# Patient Record
Sex: Female | Born: 1955 | Race: White | Hispanic: No | Marital: Married | Smoking: Never smoker
Health system: Southern US, Community
[De-identification: ages and names within clinical notes are randomized; demographics above are authoritative.]

## PROBLEM LIST (undated history)

## (undated) DIAGNOSIS — N289 Disorder of kidney and ureter, unspecified: Secondary | ICD-10-CM

## (undated) DIAGNOSIS — R112 Nausea with vomiting, unspecified: Secondary | ICD-10-CM

## (undated) DIAGNOSIS — A048 Other specified bacterial intestinal infections: Secondary | ICD-10-CM

## (undated) DIAGNOSIS — I7300 Raynaud's syndrome without gangrene: Secondary | ICD-10-CM

## (undated) DIAGNOSIS — N6310 Unspecified lump in the right breast, unspecified quadrant: Secondary | ICD-10-CM

## (undated) DIAGNOSIS — M1990 Unspecified osteoarthritis, unspecified site: Secondary | ICD-10-CM

## (undated) DIAGNOSIS — F32A Depression, unspecified: Secondary | ICD-10-CM

## (undated) DIAGNOSIS — T8859XA Other complications of anesthesia, initial encounter: Secondary | ICD-10-CM

## (undated) DIAGNOSIS — E0500 Thyrotoxicosis with diffuse goiter without thyrotoxic crisis or storm: Secondary | ICD-10-CM

## (undated) DIAGNOSIS — K760 Fatty (change of) liver, not elsewhere classified: Secondary | ICD-10-CM

## (undated) DIAGNOSIS — J45909 Unspecified asthma, uncomplicated: Secondary | ICD-10-CM

## (undated) DIAGNOSIS — T4145XA Adverse effect of unspecified anesthetic, initial encounter: Secondary | ICD-10-CM

## (undated) DIAGNOSIS — F329 Major depressive disorder, single episode, unspecified: Secondary | ICD-10-CM

## (undated) DIAGNOSIS — K635 Polyp of colon: Secondary | ICD-10-CM

## (undated) DIAGNOSIS — F419 Anxiety disorder, unspecified: Secondary | ICD-10-CM

## (undated) DIAGNOSIS — Z9889 Other specified postprocedural states: Secondary | ICD-10-CM

## (undated) DIAGNOSIS — N390 Urinary tract infection, site not specified: Secondary | ICD-10-CM

## (undated) DIAGNOSIS — E0590 Thyrotoxicosis, unspecified without thyrotoxic crisis or storm: Secondary | ICD-10-CM

## (undated) DIAGNOSIS — I639 Cerebral infarction, unspecified: Secondary | ICD-10-CM

## (undated) DIAGNOSIS — H16229 Keratoconjunctivitis sicca, not specified as Sjogren's, unspecified eye: Secondary | ICD-10-CM

## (undated) DIAGNOSIS — E785 Hyperlipidemia, unspecified: Secondary | ICD-10-CM

## (undated) DIAGNOSIS — M810 Age-related osteoporosis without current pathological fracture: Secondary | ICD-10-CM

## (undated) HISTORY — DX: Urinary tract infection, site not specified: N39.0

## (undated) HISTORY — DX: Keratoconjunctivitis sicca, not specified as Sjogren's, unspecified eye: H16.229

## (undated) HISTORY — PX: PARATHYROIDECTOMY: SHX19

## (undated) HISTORY — DX: Fatty (change of) liver, not elsewhere classified: K76.0

## (undated) HISTORY — DX: Unspecified asthma, uncomplicated: J45.909

## (undated) HISTORY — PX: CHOLECYSTECTOMY: SHX55

## (undated) HISTORY — PX: ABDOMINAL HYSTERECTOMY: SHX81

## (undated) HISTORY — DX: Other specified bacterial intestinal infections: A04.8

## (undated) HISTORY — DX: Polyp of colon: K63.5

---

## 1998-02-06 ENCOUNTER — Ambulatory Visit (HOSPITAL_COMMUNITY): Admission: RE | Admit: 1998-02-06 | Discharge: 1998-02-06 | Payer: Self-pay | Admitting: Endocrinology

## 1998-02-06 ENCOUNTER — Encounter: Payer: Self-pay | Admitting: Endocrinology

## 2014-11-30 ENCOUNTER — Encounter: Payer: Self-pay | Admitting: Gastroenterology

## 2014-11-30 HISTORY — PX: COLONOSCOPY: SHX174

## 2015-04-09 HISTORY — PX: BREAST EXCISIONAL BIOPSY: SUR124

## 2015-04-09 HISTORY — PX: BREAST LUMPECTOMY: SHX2

## 2016-02-13 ENCOUNTER — Other Ambulatory Visit: Payer: Self-pay | Admitting: Internal Medicine

## 2016-02-13 DIAGNOSIS — N6489 Other specified disorders of breast: Secondary | ICD-10-CM

## 2016-02-19 ENCOUNTER — Ambulatory Visit
Admission: RE | Admit: 2016-02-19 | Discharge: 2016-02-19 | Disposition: A | Discharge status: Home / Self Care | Payer: BC Managed Care – PPO | Source: Ambulatory Visit | Attending: Internal Medicine | Admitting: Internal Medicine

## 2016-02-19 DIAGNOSIS — N6489 Other specified disorders of breast: Secondary | ICD-10-CM

## 2016-03-04 ENCOUNTER — Ambulatory Visit: Payer: Self-pay | Admitting: General Surgery

## 2016-03-04 DIAGNOSIS — N6021 Fibroadenosis of right breast: Secondary | ICD-10-CM

## 2016-03-12 ENCOUNTER — Other Ambulatory Visit: Payer: Self-pay | Admitting: General Surgery

## 2016-03-12 DIAGNOSIS — N6021 Fibroadenosis of right breast: Secondary | ICD-10-CM

## 2016-03-18 ENCOUNTER — Encounter (HOSPITAL_BASED_OUTPATIENT_CLINIC_OR_DEPARTMENT_OTHER): Payer: Self-pay | Admitting: *Deleted

## 2016-03-21 ENCOUNTER — Ambulatory Visit
Admission: RE | Admit: 2016-03-21 | Discharge: 2016-03-21 | Disposition: A | Discharge status: Home / Self Care | Payer: BC Managed Care – PPO | Source: Ambulatory Visit | Attending: General Surgery | Admitting: General Surgery

## 2016-03-21 DIAGNOSIS — N6021 Fibroadenosis of right breast: Secondary | ICD-10-CM

## 2016-03-21 NOTE — OR Nursing (Signed)
At 1426 Patient given Boost Breeze drink per breast patient surgery protocol. Boost Breeze drink to be drank in the am and completed by 06:30. Larene BeachSandy Alyrica Thurow, RN BSN CAPA, AD/MCSC.

## 2016-03-22 ENCOUNTER — Ambulatory Visit (HOSPITAL_BASED_OUTPATIENT_CLINIC_OR_DEPARTMENT_OTHER): Payer: BC Managed Care – PPO | Admitting: Anesthesiology

## 2016-03-22 ENCOUNTER — Encounter (HOSPITAL_BASED_OUTPATIENT_CLINIC_OR_DEPARTMENT_OTHER)
Admission: RE | Disposition: A | Discharge status: Home / Self Care | Payer: Self-pay | Source: Ambulatory Visit | Attending: General Surgery

## 2016-03-22 ENCOUNTER — Encounter (HOSPITAL_BASED_OUTPATIENT_CLINIC_OR_DEPARTMENT_OTHER): Payer: Self-pay | Admitting: *Deleted

## 2016-03-22 ENCOUNTER — Ambulatory Visit (HOSPITAL_BASED_OUTPATIENT_CLINIC_OR_DEPARTMENT_OTHER)
Admission: RE | Admit: 2016-03-22 | Discharge: 2016-03-22 | Disposition: A | Discharge status: Home / Self Care | Payer: BC Managed Care – PPO | Source: Ambulatory Visit | Attending: General Surgery | Admitting: General Surgery

## 2016-03-22 ENCOUNTER — Ambulatory Visit
Admission: RE | Admit: 2016-03-22 | Discharge: 2016-03-22 | Disposition: A | Discharge status: Home / Self Care | Payer: BC Managed Care – PPO | Source: Ambulatory Visit | Attending: General Surgery | Admitting: General Surgery

## 2016-03-22 DIAGNOSIS — Z8349 Family history of other endocrine, nutritional and metabolic diseases: Secondary | ICD-10-CM | POA: Insufficient documentation

## 2016-03-22 DIAGNOSIS — E7800 Pure hypercholesterolemia, unspecified: Secondary | ICD-10-CM | POA: Insufficient documentation

## 2016-03-22 DIAGNOSIS — Z8249 Family history of ischemic heart disease and other diseases of the circulatory system: Secondary | ICD-10-CM | POA: Diagnosis not present

## 2016-03-22 DIAGNOSIS — E079 Disorder of thyroid, unspecified: Secondary | ICD-10-CM | POA: Insufficient documentation

## 2016-03-22 DIAGNOSIS — Z9049 Acquired absence of other specified parts of digestive tract: Secondary | ICD-10-CM | POA: Diagnosis not present

## 2016-03-22 DIAGNOSIS — F329 Major depressive disorder, single episode, unspecified: Secondary | ICD-10-CM | POA: Insufficient documentation

## 2016-03-22 DIAGNOSIS — Z90722 Acquired absence of ovaries, bilateral: Secondary | ICD-10-CM | POA: Insufficient documentation

## 2016-03-22 DIAGNOSIS — N6489 Other specified disorders of breast: Secondary | ICD-10-CM | POA: Diagnosis present

## 2016-03-22 DIAGNOSIS — N63 Unspecified lump in unspecified breast: Secondary | ICD-10-CM | POA: Diagnosis present

## 2016-03-22 DIAGNOSIS — N6021 Fibroadenosis of right breast: Secondary | ICD-10-CM | POA: Insufficient documentation

## 2016-03-22 DIAGNOSIS — Z8041 Family history of malignant neoplasm of ovary: Secondary | ICD-10-CM | POA: Insufficient documentation

## 2016-03-22 DIAGNOSIS — Z79899 Other long term (current) drug therapy: Secondary | ICD-10-CM | POA: Diagnosis not present

## 2016-03-22 DIAGNOSIS — F419 Anxiety disorder, unspecified: Secondary | ICD-10-CM | POA: Diagnosis not present

## 2016-03-22 DIAGNOSIS — Z8261 Family history of arthritis: Secondary | ICD-10-CM | POA: Insufficient documentation

## 2016-03-22 DIAGNOSIS — N6081 Other benign mammary dysplasias of right breast: Secondary | ICD-10-CM | POA: Diagnosis not present

## 2016-03-22 DIAGNOSIS — N6011 Diffuse cystic mastopathy of right breast: Secondary | ICD-10-CM | POA: Insufficient documentation

## 2016-03-22 DIAGNOSIS — Z833 Family history of diabetes mellitus: Secondary | ICD-10-CM | POA: Diagnosis not present

## 2016-03-22 DIAGNOSIS — Z90710 Acquired absence of both cervix and uterus: Secondary | ICD-10-CM | POA: Insufficient documentation

## 2016-03-22 DIAGNOSIS — Z86010 Personal history of colonic polyps: Secondary | ICD-10-CM | POA: Diagnosis not present

## 2016-03-22 HISTORY — DX: Adverse effect of unspecified anesthetic, initial encounter: T41.45XA

## 2016-03-22 HISTORY — DX: Depression, unspecified: F32.A

## 2016-03-22 HISTORY — DX: Thyrotoxicosis with diffuse goiter without thyrotoxic crisis or storm: E05.00

## 2016-03-22 HISTORY — DX: Other specified postprocedural states: Z98.890

## 2016-03-22 HISTORY — DX: Other complications of anesthesia, initial encounter: T88.59XA

## 2016-03-22 HISTORY — DX: Age-related osteoporosis without current pathological fracture: M81.0

## 2016-03-22 HISTORY — DX: Unspecified lump in the right breast, unspecified quadrant: N63.10

## 2016-03-22 HISTORY — DX: Raynaud's syndrome without gangrene: I73.00

## 2016-03-22 HISTORY — DX: Cerebral infarction, unspecified: I63.9

## 2016-03-22 HISTORY — PX: BREAST LUMPECTOMY WITH RADIOACTIVE SEED LOCALIZATION: SHX6424

## 2016-03-22 HISTORY — DX: Other specified postprocedural states: R11.2

## 2016-03-22 HISTORY — DX: Major depressive disorder, single episode, unspecified: F32.9

## 2016-03-22 HISTORY — DX: Thyrotoxicosis, unspecified without thyrotoxic crisis or storm: E05.90

## 2016-03-22 HISTORY — DX: Unspecified osteoarthritis, unspecified site: M19.90

## 2016-03-22 HISTORY — DX: Anxiety disorder, unspecified: F41.9

## 2016-03-22 HISTORY — DX: Hyperlipidemia, unspecified: E78.5

## 2016-03-22 SURGERY — BREAST LUMPECTOMY WITH RADIOACTIVE SEED LOCALIZATION
Anesthesia: General | Site: Breast | Laterality: Right

## 2016-03-22 MED ORDER — ONDANSETRON HCL 4 MG/2ML IJ SOLN
INTRAMUSCULAR | Status: AC
  Filled 2016-03-22: qty 2

## 2016-03-22 MED ORDER — DEXAMETHASONE SODIUM PHOSPHATE 10 MG/ML IJ SOLN
INTRAMUSCULAR | Status: AC
  Filled 2016-03-22: qty 1

## 2016-03-22 MED ORDER — CHLORHEXIDINE GLUCONATE CLOTH 2 % EX PADS: 6.0000 | MEDICATED_PAD | Freq: Once | CUTANEOUS | Status: DC

## 2016-03-22 MED ORDER — BACITRACIN ZINC 500 UNIT/GM EX OINT
TOPICAL_OINTMENT | CUTANEOUS | Status: AC
  Filled 2016-03-22: qty 1.8

## 2016-03-22 MED ORDER — PROPOFOL 10 MG/ML IV BOLUS
INTRAVENOUS | Status: AC
  Filled 2016-03-22: qty 20

## 2016-03-22 MED ORDER — MEPERIDINE HCL 25 MG/ML IJ SOLN: 6.2500 mg | INTRAMUSCULAR | Status: DC | PRN

## 2016-03-22 MED ORDER — FENTANYL CITRATE (PF) 100 MCG/2ML IJ SOLN
INTRAMUSCULAR | Status: AC
  Filled 2016-03-22: qty 2

## 2016-03-22 MED ORDER — LIDOCAINE 2% (20 MG/ML) 5 ML SYRINGE
INTRAMUSCULAR | Status: DC | PRN
  Administered 2016-03-22: 100 mg via INTRAVENOUS

## 2016-03-22 MED ORDER — BUPIVACAINE HCL (PF) 0.25 % IJ SOLN
INTRAMUSCULAR | Status: AC
Start: 2016-03-22 — End: 2016-03-22
  Filled 2016-03-22: qty 30

## 2016-03-22 MED ORDER — BUPIVACAINE-EPINEPHRINE (PF) 0.5% -1:200000 IJ SOLN
INTRAMUSCULAR | Status: AC
  Filled 2016-03-22: qty 1.8

## 2016-03-22 MED ORDER — LIDOCAINE 2% (20 MG/ML) 5 ML SYRINGE
INTRAMUSCULAR | Status: AC
  Filled 2016-03-22: qty 5

## 2016-03-22 MED ORDER — OXYCODONE HCL 5 MG/5ML PO SOLN: 5.0000 mg | Freq: Once | ORAL | Status: DC | PRN

## 2016-03-22 MED ORDER — HYDROMORPHONE HCL 1 MG/ML IJ SOLN
INTRAMUSCULAR | Status: AC
  Filled 2016-03-22: qty 1

## 2016-03-22 MED ORDER — HYDROMORPHONE HCL 1 MG/ML IJ SOLN
0.2500 mg | INTRAMUSCULAR | Status: DC | PRN
  Administered 2016-03-22 (×2): 0.25 mg via INTRAVENOUS

## 2016-03-22 MED ORDER — BUPIVACAINE-EPINEPHRINE (PF) 0.25% -1:200000 IJ SOLN
INTRAMUSCULAR | Status: AC
  Filled 2016-03-22: qty 30

## 2016-03-22 MED ORDER — HYDROCODONE-ACETAMINOPHEN 5-325 MG PO TABS: 1.0000 | ORAL_TABLET | ORAL | 0 refills | Status: DC | PRN

## 2016-03-22 MED ORDER — CEFAZOLIN SODIUM-DEXTROSE 2-4 GM/100ML-% IV SOLN
2.0000 g | INTRAVENOUS | Status: AC
  Administered 2016-03-22: 2 g via INTRAVENOUS

## 2016-03-22 MED ORDER — OXYCODONE HCL 5 MG PO TABS: 5.0000 mg | ORAL_TABLET | Freq: Once | ORAL | Status: DC | PRN

## 2016-03-22 MED ORDER — CEFAZOLIN SODIUM-DEXTROSE 2-4 GM/100ML-% IV SOLN
INTRAVENOUS | Status: AC
  Filled 2016-03-22: qty 100

## 2016-03-22 MED ORDER — PROPOFOL 500 MG/50ML IV EMUL
INTRAVENOUS | Status: AC
  Filled 2016-03-22: qty 50

## 2016-03-22 MED ORDER — DEXAMETHASONE SODIUM PHOSPHATE 4 MG/ML IJ SOLN
INTRAMUSCULAR | Status: DC | PRN
  Administered 2016-03-22: 10 mg via INTRAVENOUS

## 2016-03-22 MED ORDER — LACTATED RINGERS IV SOLN
INTRAVENOUS | Status: DC
  Administered 2016-03-22: 10 mL/h via INTRAVENOUS
  Administered 2016-03-22: 12:00:00 via INTRAVENOUS

## 2016-03-22 MED ORDER — MIDAZOLAM HCL 2 MG/2ML IJ SOLN
1.0000 mg | INTRAMUSCULAR | Status: DC | PRN
  Administered 2016-03-22 (×2): 1 mg via INTRAVENOUS

## 2016-03-22 MED ORDER — ONDANSETRON HCL 4 MG/2ML IJ SOLN
INTRAMUSCULAR | Status: DC | PRN
  Administered 2016-03-22: 4 mg via INTRAVENOUS

## 2016-03-22 MED ORDER — CIPROFLOXACIN-DEXAMETHASONE 0.3-0.1 % OT SUSP
OTIC | Status: AC
  Filled 2016-03-22: qty 7.5

## 2016-03-22 MED ORDER — BUPIVACAINE HCL (PF) 0.25 % IJ SOLN
INTRAMUSCULAR | Status: DC | PRN
  Administered 2016-03-22: 20 mL

## 2016-03-22 MED ORDER — SCOPOLAMINE 1 MG/3DAYS TD PT72: 1.0000 | MEDICATED_PATCH | Freq: Once | TRANSDERMAL | Status: DC | PRN

## 2016-03-22 MED ORDER — LIDOCAINE HCL 2 % IJ SOLN
INTRAMUSCULAR | Status: AC
  Filled 2016-03-22: qty 20

## 2016-03-22 MED ORDER — PROPOFOL 10 MG/ML IV BOLUS
INTRAVENOUS | Status: DC | PRN
  Administered 2016-03-22: 150 mg via INTRAVENOUS

## 2016-03-22 MED ORDER — PROMETHAZINE HCL 25 MG/ML IJ SOLN: 6.2500 mg | INTRAMUSCULAR | Status: DC | PRN

## 2016-03-22 MED ORDER — FENTANYL CITRATE (PF) 100 MCG/2ML IJ SOLN
50.0000 ug | INTRAMUSCULAR | Status: AC | PRN
  Administered 2016-03-22: 25 ug via INTRAVENOUS
  Administered 2016-03-22: 50 ug via INTRAVENOUS
  Administered 2016-03-22: 25 ug via INTRAVENOUS

## 2016-03-22 MED ORDER — MIDAZOLAM HCL 2 MG/2ML IJ SOLN
INTRAMUSCULAR | Status: AC
  Filled 2016-03-22: qty 2

## 2016-03-22 SURGICAL SUPPLY — 47 items
ADH SKN CLS APL DERMABOND .7 (GAUZE/BANDAGES/DRESSINGS) ×1
APPLIER CLIP 9.375 MED OPEN (MISCELLANEOUS)
APR CLP MED 9.3 20 MLT OPN (MISCELLANEOUS)
BLADE SURG 15 STRL LF DISP TIS (BLADE) ×1 IMPLANT
BLADE SURG 15 STRL SS (BLADE) ×3
CANISTER SUC SOCK COL 7IN (MISCELLANEOUS) ×1 IMPLANT
CANISTER SUCT 1200ML W/VALVE (MISCELLANEOUS) ×3 IMPLANT
CHLORAPREP W/TINT 26ML (MISCELLANEOUS) ×3 IMPLANT
CLIP APPLIE 9.375 MED OPEN (MISCELLANEOUS) IMPLANT
COVER BACK TABLE 60X90IN (DRAPES) ×3 IMPLANT
COVER MAYO STAND STRL (DRAPES) ×3 IMPLANT
COVER PROBE W GEL 5X96 (DRAPES) ×3 IMPLANT
DECANTER SPIKE VIAL GLASS SM (MISCELLANEOUS) IMPLANT
DERMABOND ADVANCED (GAUZE/BANDAGES/DRESSINGS) ×2
DERMABOND ADVANCED .7 DNX12 (GAUZE/BANDAGES/DRESSINGS) ×1 IMPLANT
DEVICE DUBIN W/COMP PLATE 8390 (MISCELLANEOUS) ×3 IMPLANT
DRAPE LAPAROSCOPIC ABDOMINAL (DRAPES) ×2 IMPLANT
DRAPE UTILITY XL STRL (DRAPES) ×3 IMPLANT
ELECT COATED BLADE 2.86 ST (ELECTRODE) ×3 IMPLANT
ELECT REM PT RETURN 9FT ADLT (ELECTROSURGICAL) ×3
ELECTRODE REM PT RTRN 9FT ADLT (ELECTROSURGICAL) ×1 IMPLANT
GLOVE BIO SURGEON STRL SZ7.5 (GLOVE) ×6 IMPLANT
GLOVE BIOGEL PI IND STRL 7.0 (GLOVE) IMPLANT
GLOVE BIOGEL PI INDICATOR 7.0 (GLOVE) ×2
GLOVE EXAM NITRILE EXT CUFF MD (GLOVE) ×2 IMPLANT
GLOVE SURG SS PI 6.5 STRL IVOR (GLOVE) ×2 IMPLANT
GOWN STRL REUS W/ TWL LRG LVL3 (GOWN DISPOSABLE) ×2 IMPLANT
GOWN STRL REUS W/TWL LRG LVL3 (GOWN DISPOSABLE) ×6
ILLUMINATOR WAVEGUIDE N/F (MISCELLANEOUS) IMPLANT
KIT MARKER MARGIN INK (KITS) ×3 IMPLANT
LIGHT WAVEGUIDE WIDE FLAT (MISCELLANEOUS) ×2 IMPLANT
NDL HYPO 25X1 1.5 SAFETY (NEEDLE) IMPLANT
NEEDLE HYPO 25X1 1.5 SAFETY (NEEDLE) ×3 IMPLANT
NS IRRIG 1000ML POUR BTL (IV SOLUTION) ×2 IMPLANT
PACK BASIN DAY SURGERY FS (CUSTOM PROCEDURE TRAY) ×3 IMPLANT
PENCIL BUTTON HOLSTER BLD 10FT (ELECTRODE) ×3 IMPLANT
SLEEVE SCD COMPRESS KNEE MED (MISCELLANEOUS) ×3 IMPLANT
SPONGE LAP 18X18 X RAY DECT (DISPOSABLE) ×3 IMPLANT
SUT MON AB 4-0 PC3 18 (SUTURE) ×2 IMPLANT
SUT SILK 2 0 SH (SUTURE) IMPLANT
SUT VICRYL 3-0 CR8 SH (SUTURE) ×3 IMPLANT
SYR CONTROL 10ML LL (SYRINGE) ×2 IMPLANT
TOWEL OR 17X24 6PK STRL BLUE (TOWEL DISPOSABLE) ×3 IMPLANT
TOWEL OR NON WOVEN STRL DISP B (DISPOSABLE) ×1 IMPLANT
TUBE CONNECTING 20'X1/4 (TUBING) ×1
TUBE CONNECTING 20X1/4 (TUBING) ×2 IMPLANT
YANKAUER SUCT BULB TIP NO VENT (SUCTIONS) ×2 IMPLANT

## 2016-03-22 NOTE — Discharge Instructions (Signed)

## 2016-03-22 NOTE — Op Note (Signed)
03/22/2016  11:18 AM  PATIENT:  Beverly Perry  60 y.o. female  PRE-OPERATIVE DIAGNOSIS:  RIGHT BREAST COMPLEX SCLEROSING LESION  POST-OPERATIVE DIAGNOSIS:  RIGHT BREAST COMPLEX SCLEROSING LESION  PROCEDURE:  Procedure(s): RIGHT BREAST LUMPECTOMY WITH RADIOACTIVE SEED LOCALIZATION (Right)  SURGEON:  Surgeon(s) and Role:    * Griselda MinerPaul Toth III, MD - Primary  PHYSICIAN ASSISTANT:   ASSISTANTS: none   ANESTHESIA:   local and general  EBL:  No intake/output data recorded.  BLOOD ADMINISTERED:none  DRAINS: none   LOCAL MEDICATIONS USED:  MARCAINE     SPECIMEN:  Source of Specimen:  right breast tissue and additional inferior and deep margin  DISPOSITION OF SPECIMEN:  PATHOLOGY  COUNTS:  YES  TOURNIQUET:  * No tourniquets in log *  DICTATION: .Dragon Dictation   After informed consent was obtained the patient was brought to the operating room and placed in the supine position on the operating room table. After adequate induction of general anesthesia the patient's right breast was prepped with ChloraPrep, allowed to dry, and draped in usual sterile manner. An appropriate timeout was performed. Previously an I-125 seed was placed in the upper outer quadrant of the right breast to mark an area of a complex sclerosing lesion. The neoprobe was set to I-125. The area of radioactivity was readily identified in the upper outer right breast. Next a curvilinear incision was made along the upper outer edge of the areola. The incision was carried through the skin and subcutaneous tissue sharply with electrocautery. The dissection was then carried toward the seed under the direction of the neoprobe sharply with the electrocautery. Once I approach the area of the radioactive seed then removed a circular portion of breast tissue sharply around the radioactive seed with the electrocautery while checking the area of radioactivity frequently with the neoprobe. Once the specimen was removed it was oriented  with the appropriate paint colors. A specimen radiograph was obtained that showed the seed to be near the center of the specimen. I did not see the clip. I then removed and additional inferior and deep margin sharply with the electrocautery. I x-rayed this tissue and the clip was in this tissue. This was sent separately to pathology. Hemostasis was achieved using the Bovie electrocautery. The wound was then irrigated with saline and infiltrated with quarter percent Marcaine. The deep layer of the wound was then closed with layers of interrupted 3-0 Vicryl stitches. The skin was then closed with interrupted 4-0 Monocryl subcuticular stitches. Dermabond dressings were applied. The patient tolerated the procedure well. At the end of the case all needle sponge and instrument counts were correct. The patient was then awakened and taken to recovery in stable condition.  PLAN OF CARE: Discharge to home after PACU  PATIENT DISPOSITION:  PACU - hemodynamically stable.   Delay start of Pharmacological VTE agent (>24hrs) due to surgical blood loss or risk of bleeding: not applicable

## 2016-03-22 NOTE — Interval H&P Note (Signed)
History and Physical Interval Note:  03/22/2016 10:04 AM  Beverly Perry  has presented today for surgery, with the diagnosis of RIGHT BREAST COMPLEX SCLEROSING LESION  The various methods of treatment have been discussed with the patient and family. After consideration of risks, benefits and other options for treatment, the patient has consented to  Procedure(s): RIGHT BREAST LUMPECTOMY WITH RADIOACTIVE SEED LOCALIZATION (N/A) as a surgical intervention .  The patient's history has been reviewed, patient examined, no change in status, stable for surgery.  I have reviewed the patient's chart and labs.  Questions were answered to the patient's satisfaction.     TOTH III,PAUL S

## 2016-03-22 NOTE — H&P (Signed)
[facility]  [facility] Patient #: [id number] DOB: Mar 02, 1956 Married / Language: English / Race: White Female   History of Present Illness  The patient is a 60 year old female who presents with a breast mass. We are asked to see the patient in consultation by Dr. Anselmo Picklerandy Jackson to evaluate her for a right breast complex sclerosing lesion. The patient is a 60 year old white female who recently went for a routine screening mammogram. At that time she was found to have an abnormality in the upper outer quadrant of the right breast. This was biopsied and came back as a complex sclerosing lesion. She denies any significant breast pain. She denies any discharge from the nipple. She has had a previous left sided needle biopsy but she does not know what it showed. This was done in [place] 6 or 7 years ago. She otherwise denies any significant family history of breast cancer   Other Problems  Anxiety Disorder  Cholelithiasis  Depression  Hypercholesterolemia  Oophorectomy  Bilateral. Thyroid Disease   Past Surgical History  Breast Biopsy  Bilateral. Colon Polyp Removal - Colonoscopy  Gallbladder Surgery - Laparoscopic  Hysterectomy (not due to cancer) - Complete  Thyroid Surgery  Tonsillectomy   Diagnostic Studies History  Colonoscopy  1-5 years ago Mammogram  within last year Pap Smear  >5 years ago  Allergies No Known Drug Allergies   Medication History  ClonazePAM (0.5MG  Tablet, Oral two times daily) Active. Ultram (50MG  Tablet, Oral as needed) Active. (Take 1/2 to 1 tab by mouth as needed.) Acetaminophen (325MG  Tablet, Oral daily) Active. Pilocarpine (5mg  three times daily) Active. Synthroid (137MCG Tablet, Oral daily) Active. Vitamin D (50000U Capsule, Oral daily) Active. Calcium 1000 + D (1000-800MG -UNIT Tablet, Oral daily) Active. Wellbutrin SR (150MG  Tablet ER 12HR, Oral daily) Active. (Take 3 tablets daily.) Fish  Oil (300MG  Capsule, Oral daily) Active. B12 Liquid Health Booster (1000MCG/15ML Liquid, injection Oral once a month) Active. Prolia (60MG /ML Solution, Subcutaneous) Active. (Inject once a month.) Medications Reconciled  Social History Alcohol use  Occasional alcohol use. Caffeine use  Coffee. No drug use  Tobacco use  Never smoker.  Family History  Anesthetic complications  Sister. Arthritis  Father. Cerebrovascular Accident  Father, Sister. Colon Polyps  Family Members In General. Depression  Family Members In General. Diabetes Mellitus  Father. Heart Disease  Father, Sister. Heart disease in female family member before age 61  Heart disease in female family member before age 61  Hypertension  Father, Sister. Ovarian Cancer  Sister. Prostate Cancer  Father. Thyroid problems  Family Members In General, Father.  Pregnancy / Birth History  Age at menarche  9 years. Contraceptive History  Oral contraceptives. Gravida  2 Maternal age  61-20 Para  2    Review of Systems  General Not Present- Appetite Loss, Chills, Fatigue, Fever, Night Sweats, Weight Gain and Weight Loss. Skin Present- Dryness. Not Present- Change in Wart/Mole, Hives, Jaundice, New Lesions, Non-Healing Wounds, Rash and Ulcer. HEENT Present- Seasonal Allergies and Wears glasses/contact lenses. Not Present- Earache, Hearing Loss, Hoarseness, Nose Bleed, Oral Ulcers, Ringing in the Ears, Sinus Pain, Sore Throat, Visual Disturbances and Yellow Eyes. Respiratory Present- Snoring. Not Present- Bloody sputum, Chronic Cough, Difficulty Breathing and Wheezing. Breast Present- Breast Mass. Not Present- Breast Pain, Nipple Discharge and Skin Changes. Cardiovascular Present- Leg Cramps. Not Present- Chest Pain, Difficulty Breathing Lying Down, Palpitations, Rapid Heart Rate, Shortness of Breath and Swelling of Extremities. Gastrointestinal Not Present- Abdominal Pain, Bloating, Bloody  Stool, Change  in Bowel Habits, Chronic diarrhea, Constipation, Difficulty Swallowing, Excessive gas, Gets full quickly at meals, Hemorrhoids, Indigestion, Nausea, Rectal Pain and Vomiting. Female Genitourinary Not Present- Frequency, Nocturia, Painful Urination, Pelvic Pain and Urgency. Musculoskeletal Present- Joint Pain and Muscle Pain. Not Present- Back Pain, Joint Stiffness, Muscle Weakness and Swelling of Extremities. Neurological Not Present- Decreased Memory, Fainting, Headaches, Numbness, Seizures, Tingling, Tremor, Trouble walking and Weakness. Psychiatric Present- Anxiety and Depression. Not Present- Bipolar, Change in Sleep Pattern, Fearful and Frequent crying. Endocrine Not Present- Cold Intolerance, Excessive Hunger, Hair Changes, Heat Intolerance, Hot flashes and New Diabetes. Hematology Present- Blood Thinners. Not Present- Easy Bruising, Excessive bleeding, Gland problems, HIV and Persistent Infections.  Vitals Weight: 138.2 lb Height: 62in Body Surface Area: 1.63 m Body Mass Index: 25.28 kg/m  Temp.: 98.41F  Pulse: 68 (Regular)  BP: 122/82 (Sitting, Left Arm, Standard)       Physical Exam General Mental Status-Alert. General Appearance-Consistent with stated age. Hydration-Well hydrated. Voice-Normal.  Head and Neck Head-normocephalic, atraumatic with no lesions or palpable masses. Trachea-midline. Thyroid Gland Characteristics - normal size and consistency.  Eye Eyeball - Bilateral-Extraocular movements intact. Sclera/Conjunctiva - Bilateral-No scleral icterus.  Chest and Lung Exam Chest and lung exam reveals -quiet, even and easy respiratory effort with no use of accessory muscles and on auscultation, normal breath sounds, no adventitious sounds and normal vocal resonance. Inspection Chest Wall - Normal. Back - normal.  Breast Note: There is no palpable mass in either breast. There is no palpable axillary, supraclavicular, or cervical  lymphadenopathy. She does have a large number of moles covering most of her skin distribution   Cardiovascular Cardiovascular examination reveals -normal heart sounds, regular rate and rhythm with no murmurs and normal pedal pulses bilaterally.  Abdomen Inspection Inspection of the abdomen reveals - No Hernias. Skin - Scar - no surgical scars. Palpation/Percussion Palpation and Percussion of the abdomen reveal - Soft, Non Tender, No Rebound tenderness, No Rigidity (guarding) and No hepatosplenomegaly. Auscultation Auscultation of the abdomen reveals - Bowel sounds normal.  Neurologic Neurologic evaluation reveals -alert and oriented x 3 with no impairment of recent or remote memory. Mental Status-Normal.  Musculoskeletal Normal Exam - Left-Upper Extremity Strength Normal and Lower Extremity Strength Normal. Normal Exam - Right-Upper Extremity Strength Normal and Lower Extremity Strength Normal.  Lymphatic Head & Neck  General Head & Neck Lymphatics: Bilateral - Description - Normal. Axillary  General Axillary Region: Bilateral - Description - Normal. Tenderness - Non Tender. Femoral & Inguinal  Generalized Femoral & Inguinal Lymphatics: Bilateral - Description - Normal. Tenderness - Non Tender.    Assessment & Plan  SCLEROSING ADENOSIS, RIGHT (N60.21) Impression: The patient appears to have a small area of complex sclerosing lesion in the upper outer quadrant of the right breast. Because of its abnormal appearance and because it can be considered a high risk lesion I would recommend having this area removed. She would also like to have this done. I have discussed with her in detail the risks and benefits of the operation to remove this area as well as some of the technical aspects and she understands and wishes to proceed. I will plan for a right breast radioactive seed localized lumpectomy. Current Plans Pt Education - Breast Diseases: discussed with patient and  provided information. Referred to Oncology, for evaluation and follow up (Oncology). Routine.

## 2016-03-22 NOTE — Anesthesia Postprocedure Evaluation (Signed)
Anesthesia Post Note  Patient: Beverly Perry  Procedure(s) Performed: Procedure(s) (LRB): RIGHT BREAST LUMPECTOMY WITH RADIOACTIVE SEED LOCALIZATION (Right)  Patient location during evaluation: PACU Anesthesia Type: General Level of consciousness: awake and alert and patient cooperative Pain management: pain level controlled Vital Signs Assessment: post-procedure vital signs reviewed and stable Respiratory status: spontaneous breathing and respiratory function stable Cardiovascular status: stable Anesthetic complications: no    Last Vitals:  Vitals:   03/22/16 1300 03/22/16 1326  BP: (!) 142/82 (!) 146/70  Pulse: 69 70  Resp: 13 16  Temp:  36.9 C    Last Pain:  Vitals:   03/22/16 1326  TempSrc:   PainSc: 4                  Mckenzye Cutright S

## 2016-03-22 NOTE — Transfer of Care (Signed)
Immediate Anesthesia Transfer of Care Note  Patient: Beverly Perry  Procedure(s) Performed: Procedure(s): RIGHT BREAST LUMPECTOMY WITH RADIOACTIVE SEED LOCALIZATION (Right)  Patient Location: PACU  Anesthesia Type:General  Level of Consciousness: sedated and responds to stimulation  Airway & Oxygen Therapy: Patient Spontanous Breathing and Patient connected to face mask oxygen  Post-op Assessment: Report given to RN and Post -op Vital signs reviewed and stable  Post vital signs: Reviewed and stable  Last Vitals:  Vitals:   03/22/16 0809  BP: 136/69  Pulse: 81  Resp: 18  Temp: 36.6 C    Last Pain:  Vitals:   03/22/16 0809  TempSrc: Oral         Complications: No apparent anesthesia complications

## 2016-03-22 NOTE — Anesthesia Preprocedure Evaluation (Signed)
Anesthesia Evaluation  Patient identified by MRN, date of birth, ID band Patient awake    Reviewed: Allergy & Precautions, NPO status , Patient's Chart, lab work & pertinent test results  History of Anesthesia Complications (+) PONV and history of anesthetic complications  Airway Mallampati: II  TM Distance: >3 FB Neck ROM: Full    Dental no notable dental hx.    Pulmonary neg pulmonary ROS,    Pulmonary exam normal breath sounds clear to auscultation       Cardiovascular + Peripheral Vascular Disease  negative cardio ROS Normal cardiovascular exam Rhythm:Regular Rate:Normal     Neuro/Psych PSYCHIATRIC DISORDERS Anxiety Depression CVA negative psych ROS   GI/Hepatic negative GI ROS, Neg liver ROS,   Endo/Other  negative endocrine ROSHyperthyroidism   Renal/GU negative Renal ROS     Musculoskeletal  (+) Arthritis ,   Abdominal   Peds  Hematology negative hematology ROS (+)   Anesthesia Other Findings   Reproductive/Obstetrics negative OB ROS                             Anesthesia Physical Anesthesia Plan  ASA: III  Anesthesia Plan: General   Post-op Pain Management:    Induction: Intravenous  Airway Management Planned: LMA  Additional Equipment:   Intra-op Plan:   Post-operative Plan: Extubation in OR  Informed Consent: I have reviewed the patients History and Physical, chart, labs and discussed the procedure including the risks, benefits and alternatives for the proposed anesthesia with the patient or authorized representative who has indicated his/her understanding and acceptance.   Dental advisory given  Plan Discussed with: CRNA  Anesthesia Plan Comments:         Anesthesia Quick Evaluation

## 2016-03-22 NOTE — Anesthesia Procedure Notes (Signed)
Procedure Name: LMA Insertion Date/Time: 03/22/2016 10:25 AM Performed by: Gar GibbonKEETON, Alyshia Kernan S Pre-anesthesia Checklist: Patient identified, Emergency Drugs available, Suction available and Patient being monitored Patient Re-evaluated:Patient Re-evaluated prior to inductionOxygen Delivery Method: Circle system utilized Preoxygenation: Pre-oxygenation with 100% oxygen Intubation Type: IV induction Ventilation: Mask ventilation without difficulty LMA: LMA inserted LMA Size: 3.0 Number of attempts: 1 Airway Equipment and Method: Bite block Placement Confirmation: positive ETCO2 Tube secured with: Tape Dental Injury: Teeth and Oropharynx as per pre-operative assessment

## 2016-03-25 ENCOUNTER — Encounter (HOSPITAL_BASED_OUTPATIENT_CLINIC_OR_DEPARTMENT_OTHER): Payer: Self-pay | Admitting: General Surgery

## 2016-12-26 ENCOUNTER — Other Ambulatory Visit: Payer: Self-pay | Admitting: Internal Medicine

## 2016-12-26 DIAGNOSIS — M810 Age-related osteoporosis without current pathological fracture: Secondary | ICD-10-CM

## 2016-12-26 DIAGNOSIS — Z1231 Encounter for screening mammogram for malignant neoplasm of breast: Secondary | ICD-10-CM

## 2017-02-14 ENCOUNTER — Ambulatory Visit
Admission: RE | Admit: 2017-02-14 | Discharge: 2017-02-14 | Disposition: A | Discharge status: Home / Self Care | Payer: BC Managed Care – PPO | Source: Ambulatory Visit | Attending: Internal Medicine | Admitting: Internal Medicine

## 2017-02-14 DIAGNOSIS — M810 Age-related osteoporosis without current pathological fracture: Secondary | ICD-10-CM

## 2017-02-14 DIAGNOSIS — Z1231 Encounter for screening mammogram for malignant neoplasm of breast: Secondary | ICD-10-CM

## 2017-02-17 ENCOUNTER — Other Ambulatory Visit: Payer: Self-pay | Admitting: Internal Medicine

## 2017-02-17 DIAGNOSIS — R928 Other abnormal and inconclusive findings on diagnostic imaging of breast: Secondary | ICD-10-CM

## 2017-02-24 ENCOUNTER — Ambulatory Visit
Admission: RE | Admit: 2017-02-24 | Discharge: 2017-02-24 | Disposition: A | Discharge status: Home / Self Care | Payer: BC Managed Care – PPO | Source: Ambulatory Visit | Attending: Internal Medicine | Admitting: Internal Medicine

## 2017-02-24 DIAGNOSIS — R928 Other abnormal and inconclusive findings on diagnostic imaging of breast: Secondary | ICD-10-CM

## 2018-02-02 ENCOUNTER — Other Ambulatory Visit: Payer: Self-pay | Admitting: Internal Medicine

## 2018-02-02 DIAGNOSIS — Z1231 Encounter for screening mammogram for malignant neoplasm of breast: Secondary | ICD-10-CM

## 2018-02-17 ENCOUNTER — Ambulatory Visit
Admission: RE | Admit: 2018-02-17 | Discharge: 2018-02-17 | Disposition: A | Discharge status: Home / Self Care | Payer: Medicare HMO | Source: Ambulatory Visit | Attending: Internal Medicine | Admitting: Internal Medicine

## 2018-02-17 DIAGNOSIS — Z1231 Encounter for screening mammogram for malignant neoplasm of breast: Secondary | ICD-10-CM

## 2018-05-01 ENCOUNTER — Encounter: Payer: Self-pay | Admitting: Gastroenterology

## 2018-05-05 ENCOUNTER — Ambulatory Visit: Payer: Medicare HMO | Admitting: Gastroenterology

## 2018-05-05 VITALS — BP 132/84 | HR 74 | Ht 60.0 in | Wt 145.4 lb

## 2018-05-05 DIAGNOSIS — R197 Diarrhea, unspecified: Secondary | ICD-10-CM

## 2018-05-05 DIAGNOSIS — R109 Unspecified abdominal pain: Secondary | ICD-10-CM | POA: Diagnosis not present

## 2018-05-05 MED ORDER — DICYCLOMINE HCL 10 MG PO CAPS: 10.0000 mg | ORAL_CAPSULE | Freq: Two times a day (BID) | ORAL | 0 refills | Status: DC

## 2018-05-05 NOTE — Progress Notes (Signed)
Chief Complaint: diarrhea  Referring Provider:  Shelbie Ammons, MD      ASSESSMENT AND PLAN;   #1.  Diarrhea- neg stool studies, neg NCCT  #2.  Right flank pain- neg NCCT 04/07/2018-except for small L renal stone, small hiatal hernia, fatty liver (with Nl LFTs)  #3.  H/O Colonic polyps  Plan: - TSH, CRP and sed rate (She will get it done from Dr [facility] office, has appt next week). - Lomotil 1 tid prn to continue. - Bentyl 10mg  po bid (1/2hr before meal and QHS) - Stop fish oil x 2 weeks. - Proceed with colonoscopy.  I have discussed the risks and benefits.  The risks including risk of perforation requiring laparotomy, bleeding after polypectomy requiring blood transfusions and risks of anesthesia/sedation were discussed.  Rare risks of missing colorectal neoplasms were also discussed.  Consent forms were given for review.    HPI:    Beverly Perry is a 63 y.o. female  Right flank pain 04/07/2018, diagnosed with Klebsiella UTI, treated with Macrobid 100 mg p.o. twice daily for 7 days Nl CBC, CMP With diarrhea x since 03/2018, 8-9/day, occ nocturnal symptoms Neg stool for c. Diff No melena or hematochezia No recent weight loss.  In fact she has gained 10 pounds over the last 5 months. Does admit that she has been under considerable stress No nausea, vomiting, heartburn, odynophagia or dysphagia. No fever or chills.  No sodas, chocolates, chewing gums and candy. NO history of travel, artificial sweeteners or history suggestive of lactose intolerance.  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 77 year old great granddaughter-living with her (social)  Past GI procedures: -Colonoscopy 11/2014 (pcf) -small tubular adenoma status post polypectomy, mild sigmoid diverticulosis.  Had tubular adenomas 2006 Past Medical History:  Diagnosis Date  . Anxiety   . Arthritis   . Breast mass, right   . Colon polyp   . Complication of anesthesia    . Depression   . Fatty liver   . Graves disease   . Hyperlipidemia   . Hyperthyroidism    had thyroid irradiated with radioactive iodine  . 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 Osteoporosis   . PONV (postoperative nausea and vomiting)   . Positive H. pylori test   . Raynaud's syndrome   . Stroke [facility])    ? CVA 3 yrs ago  . UTI (urinary tract infection)     Past Surgical History:  Procedure Laterality Date  . ABDOMINAL HYSTERECTOMY    . BREAST LUMPECTOMY Right 2017   benign  . BREAST LUMPECTOMY WITH RADIOACTIVE SEED LOCALIZATION Right 03/22/2016   Procedure: RIGHT BREAST LUMPECTOMY WITH RADIOACTIVE SEED LOCALIZATION;  Surgeon: Chevis Pretty III, MD;  Location: [facility];  Service: General;  Laterality: Right;  . CHOLECYSTECTOMY    . COLONOSCOPY  11/30/2014   Colonic polyp status post polypectomy. Mild colonic diverticulosis.   Marland Kitchen PARATHYROIDECTOMY      Family History  Problem Relation Age of Onset  . Prostate cancer Father   . Crohn's disease Father   . Breast cancer Sister   . Colon polyps Sister   . Colon cancer Neg Hx        PATIENT DOESN'T KNOW MOM'S SIDE OF MEDICAL HISTORY   . Esophageal cancer Neg Hx     Social History   Tobacco Use  . Smoking status: Never Smoker  . Smokeless tobacco: Never Used  Substance  Use Topics  . Alcohol use: Yes    Comment: OCASSIONALLY  . Drug use: No    Current Outpatient Medications  Medication Sig Dispense Refill  . aspirin 325 MG tablet Take 325 mg by mouth daily.    Marland Kitchen buPROPion (WELLBUTRIN XL) 150 MG 24 hr tablet Take 450 mg by mouth daily.    . Calcium Carbonate Antacid (TUMS PO) Take 1 tablet by mouth daily.    . cholecalciferol (VITAMIN D) 1000 units tablet Take 1,000 Units by mouth daily.    . clonazePAM (KLONOPIN) 0.5 MG tablet Take 0.5 mg by mouth 2 (two) times daily as needed for anxiety.    Marland Kitchen denosumab (PROLIA) 60 MG/ML SOLN injection Inject 60 mg into the skin every 6 (six)  months. Administer in upper arm, thigh, or abdomen    . Diphenoxylate-Atropine (LOMOTIL PO) Take by mouth 4 (four) times daily as needed.    . FENOFIBRATE PO Take 200 mg by mouth daily.    Marland Kitchen HYDROcodone-acetaminophen (NORCO/VICODIN) 5-325 MG tablet Take 1-2 tablets by mouth every 4 (four) hours as needed for moderate pain or severe pain. 20 tablet 0  . levothyroxine (SYNTHROID, LEVOTHROID) 150 MCG tablet Take 150 mcg by mouth daily.    . Omega-3 Fatty Acids (FISH OIL) 1000 MG CAPS Take by mouth.    . pilocarpine (SALAGEN) 5 MG tablet Take 5 mg by mouth 3 (three) times daily.    Marland Kitchen VIT B12-METHIONINE-INOS-CHOL IM Inject into the muscle every 30 (thirty) days.      No current facility-administered medications for this visit.     No Known Allergies  Review of Systems:  Constitutional: Denies fever, chills, diaphoresis, appetite change and fatigue.  HEENT: Denies photophobia, eye pain, redness, hearing loss, ear pain, congestion, sore throat, rhinorrhea, sneezing, mouth sores, neck pain, neck stiffness and tinnitus.   Respiratory: Denies SOB, DOE, cough, chest tightness,  and wheezing.   Cardiovascular: Denies chest pain, palpitations and leg swelling.  Genitourinary: Denies dysuria, urgency, frequency, hematuria, flank pain and difficulty urinating.  Musculoskeletal: Denies myalgias, back pain, joint swelling, arthralgias and gait problem.  Skin: No rash.  Neurological: Denies dizziness, seizures, syncope, weakness, light-headedness, numbness and headaches.  Hematological: Denies adenopathy. Easy bruising, personal or family bleeding history  Psychiatric/Behavioral: No anxiety or depression     Physical Exam:    BP 132/84   Pulse 74   Ht 5' (1.524 m)   Wt 145 lb 6 oz (65.9 kg)   BMI 28.39 kg/m  Filed Weights   05/05/18 1434  Weight: 145 lb 6 oz (65.9 kg)   Constitutional:  Well-developed, in no acute distress. Psychiatric: Normal mood and affect. Behavior is normal. HEENT:  Pupils normal.  Conjunctivae are normal. No scleral icterus. Neck supple.  Cardiovascular: Normal rate, regular rhythm. No edema Pulmonary/chest: Effort normal and breath sounds normal. No wheezing, rales or rhonchi. Abdominal: Soft, nondistended. Nontender. Bowel sounds active throughout. There are no masses palpable. No hepatomegaly. Rectal:  defered Neurological: Alert and oriented to person place and time. Skin: Skin is warm and dry. No rashes noted. Extensive notes including CT scan report, ED notes were reviewed.  Copy of the CT report was given to the patient.  Edman Circle, MD 05/05/2018, 2:59 PM  Cc: Shelbie Ammons, MD

## 2018-05-05 NOTE — Patient Instructions (Signed)
If you are age 64 or older, your body mass index should be between 23-30. Your Body mass index is 28.39 kg/m. If this is out of the aforementioned range listed, please consider follow up with your Primary Care Provider.  If you are age 15 or younger, your body mass index should be between 19-25. Your Body mass index is 28.39 kg/m. If this is out of the aformentioned range listed, please consider follow up with your Primary Care Provider.   Please have the following labs drawn at Dr. Sebastian Ache office:  CRP, Sed Rate, TSH    We have sent the following medications to your pharmacy for you to pick up at your convenience: Bentyl   It has been recommended to you by your physician that you have a(n) Colonoscopy completed in March 2020. We did not schedule the procedure(s) today. Please contact our office at [phone]  to have the procedure completed.  Stop taking fish oil.   Thank you,  Dr. Lynann Bologna

## 2018-05-12 ENCOUNTER — Encounter: Payer: Self-pay | Admitting: Gastroenterology

## 2018-05-26 ENCOUNTER — Ambulatory Visit (AMBULATORY_SURGERY_CENTER): Payer: Self-pay

## 2018-05-26 ENCOUNTER — Other Ambulatory Visit: Payer: Self-pay

## 2018-05-26 ENCOUNTER — Encounter: Payer: Self-pay | Admitting: Gastroenterology

## 2018-05-26 VITALS — Ht 62.0 in | Wt 147.8 lb

## 2018-05-26 DIAGNOSIS — R197 Diarrhea, unspecified: Secondary | ICD-10-CM

## 2018-05-26 MED ORDER — NA SULFATE-K SULFATE-MG SULF 17.5-3.13-1.6 GM/177ML PO SOLN: 1.0000 | Freq: Once | ORAL | 0 refills | Status: AC

## 2018-05-26 NOTE — Progress Notes (Signed)
No egg or soy allergy known to patient  No issues with past sedation with any surgeries  or procedures, no intubation problems  No diet pills per patient No home 02 use per patient  No blood thinners per patient  Pt denies issues with constipation  No A fib or A flutter  EMMI video sent to pt's e mail  

## 2018-06-08 ENCOUNTER — Other Ambulatory Visit: Payer: Self-pay

## 2018-06-08 ENCOUNTER — Ambulatory Visit (AMBULATORY_SURGERY_CENTER): Payer: Medicare HMO | Admitting: Gastroenterology

## 2018-06-08 ENCOUNTER — Encounter: Payer: Self-pay | Admitting: Gastroenterology

## 2018-06-08 VITALS — BP 117/61 | HR 54 | Temp 97.7°F | Resp 14 | Ht 62.0 in | Wt 147.0 lb

## 2018-06-08 DIAGNOSIS — K5289 Other specified noninfective gastroenteritis and colitis: Secondary | ICD-10-CM

## 2018-06-08 DIAGNOSIS — R197 Diarrhea, unspecified: Secondary | ICD-10-CM

## 2018-06-08 MED ORDER — SODIUM CHLORIDE 0.9 % IV SOLN: 500.0000 mL | Freq: Once | INTRAVENOUS | Status: DC

## 2018-06-08 MED ORDER — DICYCLOMINE HCL 10 MG PO CAPS: 10.0000 mg | ORAL_CAPSULE | Freq: Four times a day (QID) | ORAL | 6 refills | Status: DC | PRN

## 2018-06-08 NOTE — Patient Instructions (Signed)
Biopsies taken today. Report will be completed in 1-3 weeks. You will receive a letter in the mail from Dr Chales Abrahams explaining the results from the biopsies. Absolutely no driving today. Start with a light small meal. Eggs, grits, toast, pancakes/waffles, or lean meat are good choices for this first meal.  Handouts given on diverticulosis and hemorrhoids.  YOU HAD AN ENDOSCOPIC PROCEDURE TODAY AT THE [facility]:   Refer to the procedure report that was given to you for any specific questions about what was found during the examination.  If the procedure report does not answer your questions, please call your gastroenterologist to clarify.  If you requested that your care partner not be given the details of your procedure findings, then the procedure report has been included in a sealed envelope for you to review at your convenience later.  YOU SHOULD EXPECT: Some feelings of bloating in the abdomen. Passage of more gas than usual.  Walking can help get rid of the air that was put into your GI tract during the procedure and reduce the bloating. If you had a lower endoscopy (such as a colonoscopy or flexible sigmoidoscopy) you may notice spotting of blood in your stool or on the toilet paper. If you underwent a bowel prep for your procedure, you may not have a normal bowel movement for a few days.  Please Note:  You might notice some irritation and congestion in your nose or some drainage.  This is from the oxygen used during your procedure.  There is no need for concern and it should clear up in a day or so.  SYMPTOMS TO REPORT IMMEDIATELY:   Following lower endoscopy (colonoscopy or flexible sigmoidoscopy):  Excessive amounts of blood in the stool  Significant tenderness or worsening of abdominal pains  Swelling of the abdomen that is new, acute  Fever of 100F or higher   For urgent or emergent issues, a gastroenterologist can be reached at any hour by calling [phone]  [phone].   DIET:  We do recommend a small meal at first, but then you may proceed to your regular diet.  Drink plenty of fluids but you should avoid alcoholic beverages for 24 hours.  ACTIVITY:  You should plan to take it easy for the rest of today and you should NOT DRIVE or use heavy machinery until tomorrow (because of the sedation medicines used during the test).    FOLLOW UP: Our staff will call the number listed on your records the next business day following your procedure to check on you and address any questions or concerns that you may have regarding the information given to you following your procedure. If we do not reach you, we will leave a message.  However, if you are feeling well and you are not experiencing any problems, there is no need to return our call.  We will assume that you have returned to your regular daily activities without incident.  If any biopsies were taken you will be contacted by phone or by letter within the next 1-3 weeks.  Please call us at [phone] if you have not heard about the biopsies in 3 weeks.    SIGNATURES/CONFIDENTIALITY: You and/or your care partner have signed paperwork which will be entered into your electronic medical record.  These signatures attest to the fact that that the information above on your After Visit Summary has been reviewed and is understood.  Full responsibility of the confidentiality of this discharge information lies with you  and/or your care-partner. 

## 2018-06-08 NOTE — Progress Notes (Signed)
PT taken to PACU. Monitors in place. VSS. Report given to RN. 

## 2018-06-08 NOTE — Op Note (Signed)
[facility] Patient Name: Beverly BeckmannRobin Perry Procedure Date: 06/08/2018 8:51 AM MRN: [id number] Endoscopist: Lynann Bolognaajesh Cahterine Heinzel , MD Age: [age] Referring MD:  Date of Birth: 06-08-1955 Gender: Female Account #: [id number] Procedure:                Colonoscopy Indications:              Clinically significant diarrhea of unexplained                            origin. History of tubular adenomas 2016, family                            history of colonic polyps. Medicines:                Monitored Anesthesia Care Procedure:                Pre-Anesthesia Assessment:                           - Prior to the procedure, a History and Physical                            was performed, and patient medications and                            allergies were reviewed. The patient's tolerance of                            previous anesthesia was also reviewed. The risks                            and benefits of the procedure and the sedation                            options and risks were discussed with the patient.                            All questions were answered, and informed consent                            was obtained. Prior Anticoagulants: The patient has                            taken no previous anticoagulant or antiplatelet                            agents. ASA Grade Assessment: II - A patient with                            mild systemic disease. After reviewing the risks                            and benefits, the patient was deemed in  satisfactory condition to undergo the procedure.                           After obtaining informed consent, the colonoscope                            was passed under direct vision. Throughout the                            procedure, the patient's blood pressure, pulse, and                            oxygen saturations were monitored continuously. The                            Model PCF-[id number]) scope was  introduced                            through the anus and advanced to the 4 cm into the                            ileum. The colonoscopy was performed without                            difficulty. The patient tolerated the procedure                            well. The quality of the bowel preparation was                            excellent. The terminal ileum, ileocecal valve,                            appendiceal orifice, and rectum were photographed. Scope In: 9:05:12 AM Scope Out: 9:16:27 AM Scope Withdrawal Time: 0 hours 8 minutes 20 seconds  Total Procedure Duration: 0 hours 11 minutes 15 seconds  Findings:                 A few small diverticula were found in the sigmoid                            colon and ascending colon. One diverticulum in the                            ascending colon with stool impacted. No endoscopic                            evidence of diverticulitis. Biopsies for histology                            were taken with a cold forceps from the entire                            colon for evaluation  of microscopic colitis.                            Estimated blood loss: none.                           Non-bleeding internal hemorrhoids were found during                            retroflexion. The hemorrhoids were small.                           The terminal ileum appeared normal. Biopsies were                            taken with a cold forceps for histology. Estimated                            blood loss: none.                           The exam was otherwise without abnormality. Complications:            No immediate complications. Estimated Blood Loss:     Estimated blood loss: none. Impression:               - Mild pancolonic diverticulosis.                           - Non-bleeding internal hemorrhoids.                           - Otherwise normal colonoscopy to TI. Recommendation:           - Patient has a contact number available for                             emergencies. The signs and symptoms of potential                            delayed complications were discussed with the                            patient. Return to normal activities tomorrow.                            Written discharge instructions were provided to the                            patient.                           - Resume previous diet.                           - Continue present medications.                           -  Await pathology results.                           - Repeat colonoscopy in 5 years for screening                            purposes. Earlier, if any new problems or if there                            is any change in family history.                           - Return to GI clinic PRN. Lynann Bologna, MD 06/08/2018 9:22:49 AM This report has been signed electronically.

## 2018-06-08 NOTE — Progress Notes (Signed)
Pt's states no medical or surgical changes since previsit or office visit. 

## 2018-06-09 ENCOUNTER — Telehealth: Payer: Self-pay | Admitting: *Deleted

## 2018-06-09 ENCOUNTER — Telehealth: Payer: Self-pay

## 2018-06-09 NOTE — Telephone Encounter (Signed)
Follow up call made, mailbox is full and can't accept any messages.

## 2018-06-09 NOTE — Telephone Encounter (Signed)
  Follow up Call-  Call back number 06/08/2018  Post procedure Call Back phone  # [phone]  Permission to leave phone message Yes  Some recent data might be hidden     Patient questions:  Message left to call us if necessary.  Second call.

## 2018-06-18 ENCOUNTER — Encounter: Payer: Self-pay | Admitting: Gastroenterology

## 2019-02-01 ENCOUNTER — Other Ambulatory Visit: Payer: Self-pay | Admitting: Internal Medicine

## 2019-02-01 DIAGNOSIS — Z1231 Encounter for screening mammogram for malignant neoplasm of breast: Secondary | ICD-10-CM

## 2019-02-12 ENCOUNTER — Other Ambulatory Visit: Payer: Self-pay | Admitting: Internal Medicine

## 2019-02-12 DIAGNOSIS — N644 Mastodynia: Secondary | ICD-10-CM

## 2019-02-23 ENCOUNTER — Other Ambulatory Visit: Payer: Self-pay

## 2019-02-23 ENCOUNTER — Ambulatory Visit: Payer: Medicare HMO

## 2019-02-23 ENCOUNTER — Ambulatory Visit
Admission: RE | Admit: 2019-02-23 | Discharge: 2019-02-23 | Disposition: A | Discharge status: Home / Self Care | Payer: Medicare HMO | Source: Ambulatory Visit | Attending: Internal Medicine | Admitting: Internal Medicine

## 2019-02-23 DIAGNOSIS — N644 Mastodynia: Secondary | ICD-10-CM

## 2020-01-17 ENCOUNTER — Ambulatory Visit (INDEPENDENT_AMBULATORY_CARE_PROVIDER_SITE_OTHER): Payer: Medicare Other | Admitting: Pulmonary Disease

## 2020-01-17 ENCOUNTER — Other Ambulatory Visit: Payer: Self-pay

## 2020-01-17 ENCOUNTER — Ambulatory Visit (INDEPENDENT_AMBULATORY_CARE_PROVIDER_SITE_OTHER): Payer: Medicare Other

## 2020-01-17 ENCOUNTER — Encounter: Payer: Self-pay | Admitting: Pulmonary Disease

## 2020-01-17 VITALS — BP 116/76 | HR 78 | Temp 98.0°F | Ht 61.0 in | Wt 146.8 lb

## 2020-01-17 DIAGNOSIS — J181 Lobar pneumonia, unspecified organism: Secondary | ICD-10-CM | POA: Diagnosis not present

## 2020-01-17 NOTE — Progress Notes (Signed)
Beverly Perry    [id number]    Aug 22, 1955  Primary Care Physician:Hague, Myrene Galas, MD  Referring Physician: Galvin Proffer, MD [address] 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 aspirin 325 MG tablet Take 325 mg by mouth daily.  Marland Kitchen buPROPion (WELLBUTRIN XL) 150 MG 24 hr tablet Take 450 mg by mouth daily.  . Calcium Carbonate Antacid (TUMS PO) Take 1 tablet by mouth daily.  . cholecalciferol (VITAMIN D) 1000 units tablet Take 1,000 Units by mouth daily.  . clonazePAM (KLONOPIN) 0.5 MG tablet Take 0.5 mg by mouth 2 (two) times daily as needed for anxiety.  Marland Kitchen denosumab (PROLIA) 60 MG/ML SOLN injection Inject 60 mg into the skin every 6 (six) months. Administer in upper arm, thigh, or abdomen  . dicyclomine (BENTYL) 10 MG capsule Take 1 capsule (10 mg total) by mouth 4 (four) times daily as needed for spasms.  . Diphenoxylate-Atropine (LOMOTIL PO) Take by mouth 4 (four) times daily as needed.  . FENOFIBRATE PO Take 200 mg by mouth daily.  Marland Kitchen HYDROcodone-acetaminophen (NORCO/VICODIN) 5-325 MG tablet Take 1-2 tablets by mouth every 4 (four) hours as needed for moderate pain or severe pain.  Marland Kitchen levothyroxine (SYNTHROID, LEVOTHROID) 150 MCG tablet Take 150 mcg by mouth daily.  .  Omega-3 Fatty Acids (FISH OIL) 1000 MG CAPS Take by mouth.  Marland Kitchen VIT B12-METHIONINE-INOS-CHOL IM Inject into the muscle every 30 (thirty) days.    No facility-administered encounter medications on file as of 01/17/2020.    Allergies as of 01/17/2020  . (No Known Allergies)    Past Medical History:  Diagnosis Date  . Anxiety   . Arthritis   . Breast mass, right   . Colon polyp   . Complication of anesthesia   . Depression   . Fatty liver   . Graves disease   . Hyperlipidemia   . Hyperthyroidism    had thyroid irradiated with radioactive iodine  . 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 Osteoporosis   . PONV (postoperative nausea and vomiting)   . Positive H. pylori test   . Raynaud's syndrome   . Stroke [facility])    ? CVA 3 yrs ago  . UTI (urinary tract infection)     Past Surgical History:  Procedure Laterality Date  . ABDOMINAL HYSTERECTOMY    . BREAST EXCISIONAL BIOPSY Right 2017  . BREAST LUMPECTOMY WITH RADIOACTIVE SEED LOCALIZATION Right 03/22/2016   Procedure: RIGHT BREAST LUMPECTOMY WITH RADIOACTIVE SEED LOCALIZATION;  Surgeon: Chevis Pretty III, MD;  Location: [facility];  Service: General;  Laterality: Right;  . CHOLECYSTECTOMY    . COLONOSCOPY  11/30/2014   Colonic polyp status post polypectomy. Mild  colonic diverticulosis.   Marland Kitchen PARATHYROIDECTOMY      Family History  Problem Relation Age of Onset  . Prostate cancer Father   . Crohn's disease Father   . Breast cancer Sister   . Colon polyps Sister   . Cirrhosis Sister        non alcoholic cirrhosis  . Colon cancer Neg Hx        PATIENT DOESN'T KNOW MOM'S SIDE OF MEDICAL HISTORY   . Esophageal cancer Neg Hx   . Rectal cancer Neg Hx   . Stomach cancer Neg Hx     Social History   Socioeconomic History  . Marital status: Married    Spouse name: Not on file  . Number of children: 2  . Years of education: Not on file  . Highest education level: Not on file  Occupational  History  . Not on file  Tobacco Use  . Smoking status: Never Smoker  . Smokeless tobacco: Never Used  Vaping Use  . Vaping Use: Never used  Substance and Sexual Activity  . Alcohol use: Yes    Comment: OCASSIONALLY  . Drug use: No  . Sexual activity: Not on file  Other Topics Concern  . Not on file  Social History Narrative  . Not on file   Social Determinants of Health   Financial Resource Strain:   . Difficulty of Paying Living Expenses: Not on file  Food Insecurity:   . Worried About Programme researcher, broadcasting/film/video in the Last Year: Not on file  . Ran Out of Food in the Last Year: Not on file  Transportation Needs:   . Lack of Transportation (Medical): Not on file  . Lack of Transportation (Non-Medical): Not on file  Physical Activity:   . Days of Exercise per Week: Not on file  . Minutes of Exercise per Session: Not on file  Stress:   . Feeling of Stress : Not on file  Social Connections:   . Frequency of Communication with Friends and Family: Not on file  . Frequency of Social Gatherings with Friends and Family: Not on file  . Attends Religious Services: Not on file  . Active Member of Clubs or Organizations: Not on file  . Attends Banker Meetings: Not on file  . Marital Status: Not on file  Intimate Partner Violence:   . Fear of Current or Ex-Partner: Not on file  . Emotionally Abused: Not on file  . Physically Abused: Not on file  . Sexually Abused: Not on file    Review of Systems  Respiratory: Positive for shortness of breath and wheezing.   All other systems reviewed and are negative.   Vitals:   01/17/20 1041  BP: 116/76  Pulse: 78  Temp: 98 F (36.7 C)  SpO2: 96%     Physical Exam Constitutional:      Appearance: She is obese.  HENT:     Mouth/Throat:     Mouth: Mucous membranes are moist.  Eyes:     General:        Right eye: No discharge.        Left eye: No discharge.  Cardiovascular:     Heart sounds: No murmur heard.  No  friction rub.  Pulmonary:     Effort: Pulmonary effort is normal. No respiratory distress.     Breath sounds: No stridor. No wheezing or rhonchi.  Musculoskeletal:     Cervical back: No rigidity or tenderness.  Neurological:  Mental Status: She is alert.  Psychiatric:        Mood and Affect: Mood normal.    Assessment:  Recurrent pneumonia  Shortness of breath  Wheezing  Plan/Recommendations: Obtain chest x-ray  Depending on findings may require a CT scan of the chest  Obtain pulmonary function tests   Follow-up in 4 to 6 weeks  Call with any significant concerns       Virl Diamond MD [facility] 01/17/2020, 11:09 AM  CC: Galvin Proffer, MD

## 2020-01-17 NOTE — Patient Instructions (Signed)
Recurrent pneumonia  We will obtain a chest x-ray on you We will obtain a breathing study  Continue using current inhaler Use albuterol as needed  We will see you back in about 4 to 6 weeks  Call with any significant concerns

## 2020-02-04 ENCOUNTER — Emergency Department (HOSPITAL_COMMUNITY): Payer: Medicare Other

## 2020-02-04 ENCOUNTER — Emergency Department (HOSPITAL_COMMUNITY)
Admission: EM | Admit: 2020-02-04 | Discharge: 2020-02-04 | Disposition: A | Discharge status: Home / Self Care | Payer: Medicare Other | Attending: Emergency Medicine | Admitting: Emergency Medicine

## 2020-02-04 ENCOUNTER — Other Ambulatory Visit: Payer: Self-pay

## 2020-02-04 DIAGNOSIS — W19XXXA Unspecified fall, initial encounter: Secondary | ICD-10-CM

## 2020-02-04 DIAGNOSIS — W010XXA Fall on same level from slipping, tripping and stumbling without subsequent striking against object, initial encounter: Secondary | ICD-10-CM | POA: Diagnosis not present

## 2020-02-04 DIAGNOSIS — S42201A Unspecified fracture of upper end of right humerus, initial encounter for closed fracture: Secondary | ICD-10-CM | POA: Diagnosis not present

## 2020-02-04 DIAGNOSIS — Z7982 Long term (current) use of aspirin: Secondary | ICD-10-CM | POA: Insufficient documentation

## 2020-02-04 DIAGNOSIS — S4991XA Unspecified injury of right shoulder and upper arm, initial encounter: Secondary | ICD-10-CM | POA: Diagnosis present

## 2020-02-04 DIAGNOSIS — Y92000 Kitchen of unspecified non-institutional (private) residence as  the place of occurrence of the external cause: Secondary | ICD-10-CM | POA: Diagnosis not present

## 2020-02-04 LAB — CBC
HCT: 44.9 % (ref 36.0–46.0)
Hemoglobin: 15 g/dL (ref 12.0–15.0)
MCH: 29.9 pg (ref 26.0–34.0)
MCHC: 33.4 g/dL (ref 30.0–36.0)
MCV: 89.6 fL (ref 80.0–100.0)
Platelets: 229 10*3/uL (ref 150–400)
RBC: 5.01 MIL/uL (ref 3.87–5.11)
RDW: 13.1 % (ref 11.5–15.5)
WBC: 8.6 10*3/uL (ref 4.0–10.5)
nRBC: 0 % (ref 0.0–0.2)

## 2020-02-04 LAB — BASIC METABOLIC PANEL
Anion gap: 9 (ref 5–15)
BUN: 20 mg/dL (ref 8–23)
CO2: 24 mmol/L (ref 22–32)
Calcium: 9.6 mg/dL (ref 8.9–10.3)
Chloride: 106 mmol/L (ref 98–111)
Creatinine, Ser: 0.72 mg/dL (ref 0.44–1.00)
GFR, Estimated: >60 mL/min (ref >60)
Glucose, Bld: 119 mg/dL — ABNORMAL HIGH (ref 70–99)
Potassium: 4.2 mmol/L (ref 3.5–5.1)
Sodium: 139 mmol/L (ref 135–145)

## 2020-02-04 MED ORDER — HYDROMORPHONE HCL 1 MG/ML IJ SOLN
0.5000 mg | Freq: Once | INTRAMUSCULAR | Status: AC
  Administered 2020-02-04: 0.5 mg via INTRAVENOUS
  Filled 2020-02-04: qty 1

## 2020-02-04 MED ORDER — HYDROCODONE-ACETAMINOPHEN 5-325 MG PO TABS
1.0000 | ORAL_TABLET | Freq: Four times a day (QID) | ORAL | 0 refills | Status: DC | PRN
Start: 2020-02-04 — End: 2020-02-07

## 2020-02-04 MED ORDER — FENTANYL CITRATE (PF) 100 MCG/2ML IJ SOLN
50.0000 ug | Freq: Once | INTRAMUSCULAR | Status: AC
  Administered 2020-02-04: 50 ug via INTRAVENOUS
  Filled 2020-02-04: qty 2

## 2020-02-04 MED ORDER — ONDANSETRON HCL 4 MG/2ML IJ SOLN
4.0000 mg | Freq: Once | INTRAMUSCULAR | Status: AC
  Administered 2020-02-04: 4 mg via INTRAVENOUS
  Filled 2020-02-04: qty 2

## 2020-02-04 NOTE — ED Notes (Signed)
X-ray at bedside

## 2020-02-04 NOTE — Discharge Instructions (Signed)
Keep right arm in shoulder immobilizer at all times.  Call orthopedics on Monday for follow-up.  Take the pain medicine as directed.

## 2020-02-04 NOTE — ED Notes (Signed)
Called lab and added on CBC and CMP

## 2020-02-04 NOTE — Progress Notes (Signed)
Orthopedic Tech Progress Note Patient Details:  Beverly Perry November 19, 1955 [id number]  Ortho Devices Ortho Device/Splint Location: applied shoulder immobilizer to RUE Ortho Device/Splint Interventions: Ordered, Application   Post Interventions Patient Tolerated: Well Instructions Provided: Care of device   Jennye Moccasin 02/04/2020, 5:13 PM

## 2020-02-04 NOTE — ED Triage Notes (Signed)
Pt BIBA from home-  Per EMS- Pt with mechanical fall in kitchen to hardwood floor, reports hearing "a pop." Denies LOC, denies blood thinners, no head trauma. Small bruise noted to top of right foot.   Pt arrives AOx4, ambulatory at baseline.   Hx of asthma, graves disease, osteoporosis.

## 2020-02-04 NOTE — ED Provider Notes (Signed)
[facility]-EMERGENCY DEPT Provider Note   CSN: [id number] Arrival date & time: 02/04/20  1408     History Chief Complaint  Patient presents with  . Shoulder Injury    right    Beverly Perry is a 64 y.o. female.  Patient tripped over the dishwasher door in the kitchen.  Resulting in a fall did not hit her head no loss of consciousness.  Patient complaint is pain to her right shoulder area.  Patient's past medical history significant for asthma Graves' disease and osteoporosis.  Patient not on any blood thinners.        Past Medical History:  Diagnosis Date  . Anxiety   . Arthritis   . Breast mass, right   . Colon polyp   . Complication of anesthesia   . Depression   . Fatty liver   . Graves disease   . Hyperlipidemia   . Hyperthyroidism    had thyroid irradiated with radioactive iodine  . 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 Osteoporosis   . PONV (postoperative nausea and vomiting)   . Positive H. pylori test   . Raynaud's syndrome   . Stroke [facility])    ? CVA 3 yrs ago  . UTI (urinary tract infection)     There are no problems to display for this patient.   Past Surgical History:  Procedure Laterality Date  . ABDOMINAL HYSTERECTOMY    . BREAST EXCISIONAL BIOPSY Right 2017  . BREAST LUMPECTOMY WITH RADIOACTIVE SEED LOCALIZATION Right 03/22/2016   Procedure: RIGHT BREAST LUMPECTOMY WITH RADIOACTIVE SEED LOCALIZATION;  Surgeon: Chevis Pretty III, MD;  Location: [facility];  Service: General;  Laterality: Right;  . CHOLECYSTECTOMY    . COLONOSCOPY  11/30/2014   Colonic polyp status post polypectomy. Mild colonic diverticulosis.   Marland Kitchen PARATHYROIDECTOMY       OB History   No obstetric history on file.     Family History  Problem Relation Age of Onset  . Prostate cancer Father   . Crohn's disease Father   . Breast cancer Sister   . Colon polyps Sister   . Cirrhosis Sister        non alcoholic  cirrhosis  . Colon cancer Neg Hx        PATIENT DOESN'T KNOW MOM'S SIDE OF MEDICAL HISTORY   . Esophageal cancer Neg Hx   . Rectal cancer Neg Hx   . Stomach cancer Neg Hx     Social History   Tobacco Use  . Smoking status: Never Smoker  . Smokeless tobacco: Never Used  Vaping Use  . Vaping Use: Never used  Substance Use Topics  . Alcohol use: Yes    Comment: OCASSIONALLY  . Drug use: No    Home Medications Prior to Admission medications   Medication Sig Start Date End Date Taking? Authorizing Provider  aspirin 325 MG tablet Take 325 mg by mouth daily.    [provider]  buPROPion (WELLBUTRIN XL) 150 MG 24 hr tablet Take 450 mg by mouth daily.    [provider]  Calcium Carbonate Antacid (TUMS PO) Take 1 tablet by mouth daily.    [provider]  cholecalciferol (VITAMIN D) 1000 units tablet Take 1,000 Units by mouth daily.    [provider]  clonazePAM (KLONOPIN) 0.5 MG tablet Take 0.5 mg by mouth 2 (two) times daily as needed for anxiety.    [provider]  denosumab (PROLIA) 60  MG/ML SOLN injection Inject 60 mg into the skin every 6 (six) months. Administer in upper arm, thigh, or abdomen    [provider]  dicyclomine (BENTYL) 10 MG capsule Take 1 capsule (10 mg total) by mouth 4 (four) times daily as needed for spasms. 06/08/18   Lynann Bologna, MD  Diphenoxylate-Atropine (LOMOTIL PO) Take by mouth 4 (four) times daily as needed.    [provider]  FENOFIBRATE PO Take 200 mg by mouth daily.    [provider]  HYDROcodone-acetaminophen (NORCO/VICODIN) 5-325 MG tablet Take 1-2 tablets by mouth every 4 (four) hours as needed for moderate pain or severe pain. 03/22/16   Griselda Miner, MD  HYDROcodone-acetaminophen (NORCO/VICODIN) 5-325 MG tablet Take 1 tablet by mouth every 6 (six) hours as needed for moderate pain. 02/04/20   Vanetta Mulders, MD  levothyroxine (SYNTHROID, LEVOTHROID) 150 MCG tablet Take  150 mcg by mouth daily.    [provider]  Omega-3 Fatty Acids (FISH OIL) 1000 MG CAPS Take by mouth.    [provider]  VIT B12-METHIONINE-INOS-CHOL IM Inject into the muscle every 30 (thirty) days.     [provider]    Allergies    Patient has no known allergies.  Review of Systems   Review of Systems  Constitutional: Negative for chills and fever.  HENT: Negative for congestion, rhinorrhea and sore throat.   Eyes: Negative for visual disturbance.  Respiratory: Negative for cough and shortness of breath.   Cardiovascular: Negative for chest pain and leg swelling.  Gastrointestinal: Negative for abdominal pain, diarrhea, nausea and vomiting.  Genitourinary: Negative for dysuria.  Musculoskeletal: Negative for back pain and neck pain.  Skin: Negative for rash.  Neurological: Negative for dizziness, weakness, light-headedness, numbness and headaches.  Hematological: Does not bruise/bleed easily.  Psychiatric/Behavioral: Negative for confusion.    Physical Exam Updated Vital Signs BP (!) 180/84   Pulse 69   Temp 98.1 F (36.7 C) (Oral)   Resp 14   Ht 1.549 m (5\' 1" )   Wt 64.4 kg   SpO2 97%   BMI 26.83 kg/m   Physical Exam Vitals and nursing note reviewed.  Constitutional:      General: She is not in acute distress.    Appearance: Normal appearance. She is well-developed.  HENT:     Head: Normocephalic and atraumatic.  Eyes:     Extraocular Movements: Extraocular movements intact.     Conjunctiva/sclera: Conjunctivae normal.     Pupils: Pupils are equal, round, and reactive to light.  Cardiovascular:     Rate and Rhythm: Normal rate and regular rhythm.     Heart sounds: No murmur heard.   Pulmonary:     Effort: Pulmonary effort is normal. No respiratory distress.     Breath sounds: Normal breath sounds.  Abdominal:     Palpations: Abdomen is soft.     Tenderness: There is no abdominal tenderness.  Musculoskeletal:        General:  Tenderness present. Normal range of motion.     Cervical back: Normal range of motion and neck supple. No tenderness.     Comments: Tenderness to palpation to proximal right humerus and shoulder area.  No obvious deformity.  Distally radial pulses 2+.  Good movement of the fingers.  Sensation intact.  Cap refill is less than 2 seconds.  No other evidence of any other extremity injury.  Skin:    General: Skin is warm and dry.     Capillary  Refill: Capillary refill takes less than 2 seconds.  Neurological:     Mental Status: She is alert and oriented to person, place, and time.     Cranial Nerves: No cranial nerve deficit.     Sensory: No sensory deficit.     Motor: No weakness.     ED Results / Procedures / Treatments   Labs (all labs ordered are listed, but only abnormal results are displayed) Labs Reviewed  BASIC METABOLIC PANEL - Abnormal; Notable for the following components:      Result Value   Glucose, Bld 119 (*)    All other components within normal limits  CBC    EKG None  Radiology DG Shoulder Right Port  Result Date: 02/04/2020 CLINICAL DATA:  Fall with shoulder pain EXAM: PORTABLE RIGHT SHOULDER COMPARISON:  None. FINDINGS: Acute mildly comminuted fracture involving the humeral neck and greater tuberosity. No significant angulation. There is mild fracture displacement. Humeral head articulates with the glenoid. IMPRESSION: Acute mildly comminuted and displaced fracture involving the humeral neck and greater tuberosity. Electronically Signed   By: Jasmine Pang M.D.   On: 02/04/2020 15:08    Procedures Procedures (including critical care time)  Medications Ordered in ED Medications  HYDROmorphone (DILAUDID) injection 0.5 mg (has no administration in time range)  fentaNYL (SUBLIMAZE) injection 50 mcg (50 mcg Intravenous Given 02/04/20 1441)  HYDROmorphone (DILAUDID) injection 0.5 mg (0.5 mg Intravenous Given 02/04/20 1604)  ondansetron (ZOFRAN) injection 4 mg (4 mg  Intravenous Given 02/04/20 1605)    ED Course  I have reviewed the triage vital signs and the nursing notes.  Pertinent labs & imaging results that were available during my care of the patient were reviewed by me and considered in my medical decision making (see chart for details).    MDM Rules/Calculators/A&P                           X-ray shows evidence of very proximal humerus fracture.  With component of dislocation greater tuberosity fracture and humeral neck.  Reviewed with Dr. Magnus Ivan on-call for orthopedics.  I said patient can be treated with a shoulder immobilizer and follow-up with their office [facility].  Patient treated here for pain.  Prescription for pain at home.  Shoulder immobilizer placed by orthopedic tech.  No evidence of any other injuries.  Labs without any significant abnormalities.       Final Clinical Impression(s) / ED Diagnoses Final diagnoses:  Fall  Closed fracture of proximal end of right humerus, unspecified fracture morphology, initial encounter    Rx / DC Orders ED Discharge Orders         Ordered    HYDROcodone-acetaminophen (NORCO/VICODIN) 5-325 MG tablet  Every 6 hours PRN        02/04/20 1713           Vanetta Mulders, MD 02/04/20 1745

## 2020-02-07 ENCOUNTER — Encounter: Payer: Self-pay | Admitting: Physician Assistant

## 2020-02-07 ENCOUNTER — Other Ambulatory Visit: Payer: Self-pay

## 2020-02-07 ENCOUNTER — Ambulatory Visit (INDEPENDENT_AMBULATORY_CARE_PROVIDER_SITE_OTHER): Payer: Medicare Other | Admitting: Physician Assistant

## 2020-02-07 DIAGNOSIS — S4291XA Fracture of right shoulder girdle, part unspecified, initial encounter for closed fracture: Secondary | ICD-10-CM

## 2020-02-07 NOTE — Progress Notes (Signed)
Office Visit Note   Patient: Beverly Perry           Date of Birth: [birth year]           MRN: [id number] Visit Date: 02/07/2020              Requested by: Galvin Proffer, MD [address] [place],  [place] [address] PCP: Galvin Proffer, MD   Assessment & Plan: Visit Diagnoses:  1. Shoulder fracture, right, closed, initial encounter     Plan: Dr. Magnus Ivan at night discussed with the patient and her husband at length that would recommend treating this conservatively.  We will see her back in 1 week to think 2 views of the right shoulder.  She is to avoid overhead activity and abduction of the right shoulder.  She will come out of the sling for gentle range of motion elbow forearm wrist and hand.  Recommend she get a squeeze ball to help with circulation.  She is taking Tylenol for pain as the pain medications are causing nausea.  Follow-Up Instructions: Return in about 1 week (around 02/14/2020) for Radiographs.   Orders:  No orders of the defined types were placed in this encounter.  No orders of the defined types were placed in this encounter.     Procedures: No procedures performed   Clinical Data: No additional findings.   Subjective: Chief Complaint  Patient presents with  . Right Shoulder - Pain    HPI Beverly Perry is a pleasant 64 year old female comes in today with right shoulder pain status post a mechanical fall on 02/04/2020.  She went to the emergency room via EMS after tripping over the dishwasher door there was in a down position.  She was found to have a right shoulder fracture involving the humeral neck and greater tuberosity.  Slightly shortened without significant angulation.  Humeral head was well located.  Radiographs were reviewed by myself.  Was placed in a sling and told to follow-up with orthopedic surgery.  Review of Systems  Constitutional: Negative for chills and fever.     Objective: Vital Signs: There were no vitals taken for this  visit.  Physical Exam Constitutional:      Appearance: She is not ill-appearing or diaphoretic.  Pulmonary:     Effort: Pulmonary effort is normal.  Neurological:     Mental Status: She is alert and oriented to person, place, and time.  Psychiatric:        Mood and Affect: Mood normal.     Ortho Exam Right hand she has full motor of the right hand full sensation to light touch throughout. Specialty Comments:  No specialty comments available.  Imaging: No results found.   PMFS History: There are no problems to display for this patient.  Past Medical History:  Diagnosis Date  . Anxiety   . Arthritis   . Breast mass, right   . Colon polyp   . Complication of anesthesia   . Depression   . Fatty liver   . Graves disease   . Hyperlipidemia   . Hyperthyroidism    had thyroid irradiated with radioactive iodine  . 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 Osteoporosis   . PONV (postoperative nausea and vomiting)   . Positive H. pylori test   . Raynaud's syndrome   . Stroke [facility])    ? CVA 3 yrs ago  . UTI (urinary tract infection)     Family History  Problem Relation Age of Onset  . Prostate cancer Father   . Crohn's disease Father   . Breast cancer Sister   . Colon polyps Sister   . Cirrhosis Sister        non alcoholic cirrhosis  . Colon cancer Neg Hx        PATIENT DOESN'T KNOW MOM'S SIDE OF MEDICAL HISTORY   . Esophageal cancer Neg Hx   . Rectal cancer Neg Hx   . Stomach cancer Neg Hx     Past Surgical History:  Procedure Laterality Date  . ABDOMINAL HYSTERECTOMY    . BREAST EXCISIONAL BIOPSY Right 2017  . BREAST LUMPECTOMY WITH RADIOACTIVE SEED LOCALIZATION Right 03/22/2016   Procedure: RIGHT BREAST LUMPECTOMY WITH RADIOACTIVE SEED LOCALIZATION;  Surgeon: Chevis Pretty III, MD;  Location: [facility];  Service: General;  Laterality: Right;  . CHOLECYSTECTOMY    . COLONOSCOPY  11/30/2014   Colonic polyp status post  polypectomy. Mild colonic diverticulosis.   Marland Kitchen PARATHYROIDECTOMY     Social History   Occupational History  . Not on file  Tobacco Use  . Smoking status: Never Smoker  . Smokeless tobacco: Never Used  Vaping Use  . Vaping Use: Never used  Substance and Sexual Activity  . Alcohol use: Yes    Comment: OCASSIONALLY  . Drug use: No  . Sexual activity: Not on file

## 2020-02-14 ENCOUNTER — Ambulatory Visit (INDEPENDENT_AMBULATORY_CARE_PROVIDER_SITE_OTHER): Payer: Medicare Other

## 2020-02-14 ENCOUNTER — Ambulatory Visit (INDEPENDENT_AMBULATORY_CARE_PROVIDER_SITE_OTHER): Payer: Medicare Other | Admitting: Physician Assistant

## 2020-02-14 ENCOUNTER — Encounter: Payer: Self-pay | Admitting: Physician Assistant

## 2020-02-14 DIAGNOSIS — S4291XA Fracture of right shoulder girdle, part unspecified, initial encounter for closed fracture: Secondary | ICD-10-CM

## 2020-02-14 NOTE — Progress Notes (Signed)
HPI: Ms. Revolorio returns today follow-up of her right proximal humerus fracture.  She states overall she is still having pain about the shoulder and is now having some bruising going down her arm.  She has been coming out of the sling for gentle range of motion elbow wrist hand.  She is only taking Tylenol for pain during the day.  Physical exam: Right shoulder no attempts of range of motion of the shoulder.  She has good range of motion elbow full supination pronation forearm.  Full sensation throughout the hand to light touch.  Full motor of the right hand.  Radiographs: 2 views right shoulder shows the head to be well located.  Humeral neck fracture and greater tuberosity fracture remain in overall good position.  No significant healing response at this point time can be appreciated.  Impression: Right proximal humerus fracture  Plan: She will continue the sling coming out of it for gentle range of motion elbow forearm wrist and hand.  We will see her back in 4 weeks obtain 2 views of the right shoulder.  Questions were encouraged and answered at length.

## 2020-02-15 ENCOUNTER — Telehealth: Payer: Self-pay | Admitting: Pulmonary Disease

## 2020-02-15 NOTE — Telephone Encounter (Signed)
Spoke with pt, aware of recs.  Pt wishes to keep appt as is at this time.  Nothing further needed at this time- will close encounter.

## 2020-02-15 NOTE — Telephone Encounter (Signed)
She can if she is not having significant pain and discomfort that will affect/prevent her from giving an optimal effort  She can reschedule until the pain is better controlled

## 2020-02-15 NOTE — Telephone Encounter (Signed)
Spoke to patient, who stated that she fall and broke her right arm. She is questioning if it would be okay to have PFT with broken arm.  Dr. Lestine Box advise. Thanks

## 2020-02-23 ENCOUNTER — Other Ambulatory Visit: Payer: Self-pay

## 2020-02-23 ENCOUNTER — Ambulatory Visit (INDEPENDENT_AMBULATORY_CARE_PROVIDER_SITE_OTHER): Payer: Medicare Other | Admitting: Pulmonary Disease

## 2020-02-23 ENCOUNTER — Encounter: Payer: Self-pay | Admitting: Pulmonary Disease

## 2020-02-23 VITALS — BP 136/72 | HR 84 | Temp 98.3°F | Ht 62.0 in | Wt 141.0 lb

## 2020-02-23 DIAGNOSIS — R0602 Shortness of breath: Secondary | ICD-10-CM

## 2020-02-23 DIAGNOSIS — J181 Lobar pneumonia, unspecified organism: Secondary | ICD-10-CM

## 2020-02-23 LAB — PULMONARY FUNCTION TEST
DL/VA % pred: 106 %
DL/VA: 4.61 ml/min/mmHg/L
DLCO cor % pred: 93 %
DLCO cor: 16.16 ml/min/mmHg
DLCO unc % pred: 98 %
DLCO unc: 16.91 ml/min/mmHg
FEF 25-75 Post: 1.76 L/sec
FEF 25-75 Pre: 0.99 L/sec
FEF2575-%Change-Post: 77 %
FEF2575-%Pred-Post: 92 %
FEF2575-%Pred-Pre: 51 %
FEV1-%Change-Post: 16 %
FEV1-%Pred-Post: 82 %
FEV1-%Pred-Pre: 70 %
FEV1-Post: 1.69 L
FEV1-Pre: 1.45 L
FEV1FVC-%Change-Post: 6 %
FEV1FVC-%Pred-Pre: 94 %
FEV6-%Change-Post: 9 %
FEV6-%Pred-Post: 84 %
FEV6-%Pred-Pre: 77 %
FEV6-Post: 2.18 L
FEV6-Pre: 1.98 L
FEV6FVC-%Change-Post: 0 %
FEV6FVC-%Pred-Post: 104 %
FEV6FVC-%Pred-Pre: 103 %
FVC-%Change-Post: 9 %
FVC-%Pred-Post: 81 %
FVC-%Pred-Pre: 74 %
FVC-Post: 2.18 L
FVC-Pre: 2 L
Post FEV1/FVC ratio: 77 %
Post FEV6/FVC ratio: 100 %
Pre FEV1/FVC ratio: 72 %
Pre FEV6/FVC Ratio: 99 %
RV % pred: 123 %
RV: 2.33 L
TLC % pred: 97 %
TLC: 4.32 L

## 2020-02-23 MED ORDER — BREO ELLIPTA 100-25 MCG/INH IN AEPB: 1.0000 | INHALATION_SPRAY | Freq: Every day | RESPIRATORY_TRACT | 3 refills | Status: DC

## 2020-02-23 NOTE — Patient Instructions (Signed)
Shortness of breath/wheezing  .Your breathing study did reveal that you will benefit from using inhalers  .  Prescription for Breo 100 sent to pharmacy for you  -Call us if it is not working optimally -Continue using albuterol as needed -Virgel Bouquet is once a day regardless of how you are feeling  I will see you back in 3 months

## 2020-02-23 NOTE — Progress Notes (Signed)
PFT done today. 

## 2020-02-23 NOTE — Progress Notes (Signed)
Beverly Perry    [id number]    Dec 26, 1955  Primary Care Physician:Hague, Myrene Galas, MD  Referring Physician: Galvin Proffer, MD [address] 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 aspirin 325 MG tablet Take 325 mg by mouth daily.  Marland Kitchen buPROPion (WELLBUTRIN XL) 150 MG 24 hr tablet Take 450 mg by mouth daily.  . calcium carbonate (OS-CAL - DOSED IN MG OF ELEMENTAL CALCIUM) 1250 (500 Ca) MG tablet Take by mouth.  . Calcium Carbonate Antacid (TUMS PO) Take 1 tablet by mouth daily.  . cholecalciferol (VITAMIN D) 1000 units tablet Take 1,000 Units by mouth daily.  . clonazePAM (KLONOPIN) 0.5 MG tablet Take 0.5 mg by mouth 2 (two) times daily as needed for anxiety.  Marland Kitchen denosumab (PROLIA) 60 MG/ML SOLN injection Inject 60 mg into the skin every 6 (six) months. Administer in upper arm, thigh, or abdomen  . dicyclomine (BENTYL) 10 MG capsule Take 1 capsule (10 mg total) by mouth 4 (four) times daily as needed for spasms.  . Diphenoxylate-Atropine (LOMOTIL PO) Take by mouth 4 (four) times daily as needed.  . fenofibrate micronized (LOFIBRA) 200 MG capsule Take 200 mg by mouth daily.  . FENOFIBRATE PO Take 200 mg by mouth  daily.  Marland Kitchen ibandronate (BONIVA) 150 MG tablet Take 150 mg by mouth every 30 (thirty) days.  Marland Kitchen levothyroxine (SYNTHROID, LEVOTHROID) 150 MCG tablet Take 150 mcg by mouth daily.  . Omega-3 Fatty Acids (FISH OIL) 1000 MG CAPS Take by mouth.  . ondansetron (ZOFRAN) 8 MG tablet Take 8 mg by mouth every 8 (eight) hours as needed.  Marland Kitchen PROMETHEGAN 25 MG suppository SMARTSIG:1 SUPPOS Rectally Every 12 Hours PRN  . venlafaxine XR (EFFEXOR-XR) 150 MG 24 hr capsule Take 150 mg by mouth daily.  . VENTOLIN HFA 108 (90 Base) MCG/ACT inhaler Inhale 2 puffs into the lungs.  Marland Kitchen VIT B12-METHIONINE-INOS-CHOL IM Inject into the muscle every 30 (thirty) days.    No facility-administered encounter medications on file as of 02/23/2020.    Allergies as of 02/23/2020  . (No Known Allergies)    Past Medical History:  Diagnosis Date  . Anxiety   . Arthritis   . Breast mass, right   . Colon polyp   . Complication of anesthesia   . Depression   . Fatty liver   . Graves disease   . Hyperlipidemia   . Hyperthyroidism    had thyroid irradiated with radioactive iodine  . 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 Osteoporosis   . PONV (postoperative nausea and vomiting)   . Positive H. pylori test   . Raynaud's syndrome   . Stroke [facility])    ?  CVA 3 yrs ago  . UTI (urinary tract infection)     Past Surgical History:  Procedure Laterality Date  . ABDOMINAL HYSTERECTOMY    . BREAST EXCISIONAL BIOPSY Right 2017  . BREAST LUMPECTOMY WITH RADIOACTIVE SEED LOCALIZATION Right 03/22/2016   Procedure: RIGHT BREAST LUMPECTOMY WITH RADIOACTIVE SEED LOCALIZATION;  Surgeon: Chevis Pretty III, MD;  Location: [facility];  Service: General;  Laterality: Right;  . CHOLECYSTECTOMY    . COLONOSCOPY  11/30/2014   Colonic polyp status post polypectomy. Mild colonic diverticulosis.   Marland Kitchen PARATHYROIDECTOMY      Family History  Problem Relation Age of Onset  . Prostate cancer Father   . Crohn's  disease Father   . Breast cancer Sister   . Colon polyps Sister   . Cirrhosis Sister        non alcoholic cirrhosis  . Colon cancer Neg Hx        PATIENT DOESN'T KNOW MOM'S SIDE OF MEDICAL HISTORY   . Esophageal cancer Neg Hx   . Rectal cancer Neg Hx   . Stomach cancer Neg Hx     Social History   Socioeconomic History  . Marital status: Married    Spouse name: Not on file  . Number of children: 2  . Years of education: Not on file  . Highest education level: Not on file  Occupational History  . Not on file  Tobacco Use  . Smoking status: Never Smoker  . Smokeless tobacco: Never Used  Vaping Use  . Vaping Use: Never used  Substance and Sexual Activity  . Alcohol use: Yes    Comment: OCASSIONALLY  . Drug use: No  . Sexual activity: Not on file  Other Topics Concern  . Not on file  Social History Narrative  . Not on file   Social Determinants of Health   Financial Resource Strain:   . Difficulty of Paying Living Expenses: Not on file  Food Insecurity:   . Worried About Programme researcher, broadcasting/film/video in the Last Year: Not on file  . Ran Out of Food in the Last Year: Not on file  Transportation Needs:   . Lack of Transportation (Medical): Not on file  . Lack of Transportation (Non-Medical): Not on file  Physical Activity:   . Days of Exercise per Week: Not on file  . Minutes of Exercise per Session: Not on file  Stress:   . Feeling of Stress : Not on file  Social Connections:   . Frequency of Communication with Friends and Family: Not on file  . Frequency of Social Gatherings with Friends and Family: Not on file  . Attends Religious Services: Not on file  . Active Member of Clubs or Organizations: Not on file  . Attends Banker Meetings: Not on file  . Marital Status: Not on file  Intimate Partner Violence:   . Fear of Current or Ex-Partner: Not on file  . Emotionally Abused: Not on file  . Physically Abused: Not on file  . Sexually Abused: Not on file     Review of Systems  HENT: Negative.   Respiratory: Positive for shortness of breath and wheezing.   Gastrointestinal: Negative.   All other systems reviewed and are negative.   Vitals:   02/23/20 1116  BP: 136/72  Pulse: 84  Temp: 98.3 F (36.8 C)  SpO2: 96%     Physical Exam Constitutional:      Appearance: She is obese.  HENT:  Mouth/Throat:     Mouth: Mucous membranes are moist.  Cardiovascular:     Pulses: Normal pulses.     Heart sounds: No murmur heard.  No friction rub.  Pulmonary:     Effort: Pulmonary effort is normal. No respiratory distress.     Breath sounds: No stridor. No wheezing or rhonchi.  Musculoskeletal:     Cervical back: No rigidity or tenderness.  Neurological:     Mental Status: She is alert.  Psychiatric:        Mood and Affect: Mood normal.    Breathing study reviewed with the patient showing airway hyperresponsiveness, mild obstructive airway disease with significant bronchodilator response  Assessment:   Shortness of breath  Wheezing  Abnormal PFT  Plan/Recommendations: We will start on Breo 100  Encouraged to continue using albuterol as needed  I will see her back in about 3 months  Encouraged to call with any significant concerns    Virl Diamond MD [facility] 02/23/2020, 11:25 AM  CC: Galvin Proffer, MD

## 2020-03-03 ENCOUNTER — Emergency Department (HOSPITAL_COMMUNITY): Payer: Medicare Other

## 2020-03-03 ENCOUNTER — Emergency Department (HOSPITAL_COMMUNITY)
Admission: EM | Admit: 2020-03-03 | Discharge: 2020-03-03 | Disposition: A | Discharge status: Home / Self Care | Payer: Medicare Other | Attending: Emergency Medicine | Admitting: Emergency Medicine

## 2020-03-03 ENCOUNTER — Encounter (HOSPITAL_COMMUNITY): Payer: Self-pay

## 2020-03-03 ENCOUNTER — Other Ambulatory Visit: Payer: Self-pay

## 2020-03-03 DIAGNOSIS — Z8673 Personal history of transient ischemic attack (TIA), and cerebral infarction without residual deficits: Secondary | ICD-10-CM | POA: Diagnosis not present

## 2020-03-03 DIAGNOSIS — Z7982 Long term (current) use of aspirin: Secondary | ICD-10-CM | POA: Diagnosis not present

## 2020-03-03 DIAGNOSIS — M549 Dorsalgia, unspecified: Secondary | ICD-10-CM | POA: Insufficient documentation

## 2020-03-03 DIAGNOSIS — N23 Unspecified renal colic: Secondary | ICD-10-CM | POA: Insufficient documentation

## 2020-03-03 DIAGNOSIS — R319 Hematuria, unspecified: Secondary | ICD-10-CM | POA: Diagnosis present

## 2020-03-03 LAB — URINALYSIS, MICROSCOPIC (REFLEX): RBC / HPF: >50 RBC/hpf (ref 0–5)

## 2020-03-03 LAB — URINALYSIS, ROUTINE W REFLEX MICROSCOPIC
Bilirubin Urine: NEGATIVE
Glucose, UA: NEGATIVE mg/dL
Ketones, ur: NEGATIVE mg/dL
Nitrite: NEGATIVE
Protein, ur: 30 mg/dL — AB
Specific Gravity, Urine: 1.025 (ref 1.005–1.030)
pH: 5.5 (ref 5.0–8.0)

## 2020-03-03 MED ORDER — HYDROMORPHONE HCL 1 MG/ML IJ SOLN
0.5000 mg | Freq: Once | INTRAMUSCULAR | Status: AC
  Administered 2020-03-03: 0.5 mg via INTRAMUSCULAR
  Filled 2020-03-03: qty 1

## 2020-03-03 MED ORDER — POLYETHYLENE GLYCOL 3350 17 G PO PACK: 17.0000 g | PACK | Freq: Every day | ORAL | 0 refills | Status: DC

## 2020-03-03 MED ORDER — PROMETHAZINE HCL 25 MG PO TABS: 25.0000 mg | ORAL_TABLET | Freq: Four times a day (QID) | ORAL | 0 refills | Status: DC | PRN

## 2020-03-03 NOTE — ED Notes (Signed)
Pt ambulatory to and from bathroom with a steady gait.  

## 2020-03-03 NOTE — ED Notes (Signed)
Pt ambulatory to the bathroom with a steady gait.

## 2020-03-03 NOTE — ED Triage Notes (Signed)
Pt states she was seen Monday for dysuria and was dx with a UTI and given Macrobid and Tramadol. Pt states that she has been compliant with her medications, however her pain has become worse and she has had more blood in her urine than normal. Pt states that she has not been able to have a BM either which has increased her abd pain. Pt states she has taken stool softener and suppository with no relief. PT is A&Ox4.

## 2020-03-03 NOTE — ED Provider Notes (Signed)
[facility]-EMERGENCY DEPT Provider Note   CSN: [id number] Arrival date & time: 03/03/20  1810     History Chief Complaint  Patient presents with  . Dysuria  . Hematuria    Beverly Perry is a 64 y.o. female.  The history is provided by the patient. No language interpreter was used.  Dysuria Pain quality:  Aching and stabbing Pain severity:  Severe Onset quality:  Gradual Timing:  Constant Progression:  Worsening Chronicity:  New Relieved by:  Nothing Worsened by:  Nothing Ineffective treatments:  None tried Associated symptoms: no fever   Hematuria  Pt complains of burning with urination.  Pt was recently treated for a uti with macrobid.  Pt complains of back pain and blood in her urine.    Past Medical History:  Diagnosis Date  . Anxiety   . Arthritis   . Breast mass, right   . Colon polyp   . Complication of anesthesia   . Depression   . Fatty liver   . Graves disease   . Hyperlipidemia   . Hyperthyroidism    had thyroid irradiated with radioactive iodine  . 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 Osteoporosis   . PONV (postoperative nausea and vomiting)   . Positive H. pylori test   . Raynaud's syndrome   . Stroke [facility])    ? CVA 3 yrs ago  . UTI (urinary tract infection)     There are no problems to display for this patient.   Past Surgical History:  Procedure Laterality Date  . ABDOMINAL HYSTERECTOMY    . BREAST EXCISIONAL BIOPSY Right 2017  . BREAST LUMPECTOMY WITH RADIOACTIVE SEED LOCALIZATION Right 03/22/2016   Procedure: RIGHT BREAST LUMPECTOMY WITH RADIOACTIVE SEED LOCALIZATION;  Surgeon: Chevis Pretty III, MD;  Location: [facility];  Service: General;  Laterality: Right;  . CHOLECYSTECTOMY    . COLONOSCOPY  11/30/2014   Colonic polyp status post polypectomy. Mild colonic diverticulosis.   Marland Kitchen PARATHYROIDECTOMY       OB History   No obstetric history on file.     Family History    Problem Relation Age of Onset  . Prostate cancer Father   . Crohn's disease Father   . Breast cancer Sister   . Colon polyps Sister   . Cirrhosis Sister        non alcoholic cirrhosis  . Colon cancer Neg Hx        PATIENT DOESN'T KNOW MOM'S SIDE OF MEDICAL HISTORY   . Esophageal cancer Neg Hx   . Rectal cancer Neg Hx   . Stomach cancer Neg Hx     Social History   Tobacco Use  . Smoking status: Never Smoker  . Smokeless tobacco: Never Used  Vaping Use  . Vaping Use: Never used  Substance Use Topics  . Alcohol use: Yes    Comment: OCASSIONALLY  . Drug use: No    Home Medications Prior to Admission medications   Medication Sig Start Date End Date Taking? Authorizing Provider  aspirin 325 MG tablet Take 325 mg by mouth daily.    [provider]  buPROPion (WELLBUTRIN XL) 150 MG 24 hr tablet Take 450 mg by mouth daily.    [provider]  calcium carbonate (OS-CAL - DOSED IN MG OF ELEMENTAL CALCIUM) 1250 (500 Ca) MG tablet Take by mouth. 02/28/14   [provider]  Calcium Carbonate Antacid (TUMS PO) Take 1 tablet by mouth daily.  [provider]  cholecalciferol (VITAMIN D) 1000 units tablet Take 1,000 Units by mouth daily.    [provider]  clonazePAM (KLONOPIN) 0.5 MG tablet Take 0.5 mg by mouth 2 (two) times daily as needed for anxiety.    [provider]  denosumab (PROLIA) 60 MG/ML SOLN injection Inject 60 mg into the skin every 6 (six) months. Administer in upper arm, thigh, or abdomen    [provider]  dicyclomine (BENTYL) 10 MG capsule Take 1 capsule (10 mg total) by mouth 4 (four) times daily as needed for spasms. 06/08/18   Lynann Bologna, MD  Diphenoxylate-Atropine (LOMOTIL PO) Take by mouth 4 (four) times daily as needed.    [provider]  fenofibrate micronized (LOFIBRA) 200 MG capsule Take 200 mg by mouth daily. 12/22/19   [provider]  FENOFIBRATE PO Take 200 mg by mouth daily.     [provider]  fluticasone furoate-vilanterol (BREO ELLIPTA) 100-25 MCG/INH AEPB Inhale 1 puff into the lungs daily. 02/23/20   Olalere, Onnie Boer A, MD  ibandronate (BONIVA) 150 MG tablet Take 150 mg by mouth every 30 (thirty) days. 12/22/19   [provider]  levothyroxine (SYNTHROID, LEVOTHROID) 150 MCG tablet Take 150 mcg by mouth daily.    [provider]  Omega-3 Fatty Acids (FISH OIL) 1000 MG CAPS Take by mouth.    [provider]  ondansetron (ZOFRAN) 8 MG tablet Take 8 mg by mouth every 8 (eight) hours as needed. 02/05/20   [provider]  PROMETHEGAN 25 MG suppository SMARTSIG:1 SUPPOS Rectally Every 12 Hours PRN 02/05/20   [provider]  venlafaxine XR (EFFEXOR-XR) 150 MG 24 hr capsule Take 150 mg by mouth daily. 01/31/20   [provider]  VENTOLIN HFA 108 (90 Base) MCG/ACT inhaler Inhale 2 puffs into the lungs. 11/29/19   [provider]  VIT B12-METHIONINE-INOS-CHOL IM Inject into the muscle every 30 (thirty) days.     [provider]    Allergies    Patient has no known allergies.  Review of Systems   Review of Systems  Constitutional: Negative for fever.  Genitourinary: Positive for dysuria and hematuria.  All other systems reviewed and are negative.   Physical Exam Updated Vital Signs BP (!) 195/86 (BP Location: Left Arm)   Pulse 77   Temp 98.2 F (36.8 C) (Oral)   Resp 19   Ht 5\' 2"  (1.575 m)   Wt 63 kg   SpO2 98%   BMI 25.42 kg/m   Physical Exam Vitals reviewed.  Eyes:     Pupils: Pupils are equal, round, and reactive to light.  Cardiovascular:     Rate and Rhythm: Normal rate.     Pulses: Normal pulses.  Pulmonary:     Effort: Pulmonary effort is normal.  Abdominal:     General: Abdomen is flat.     Palpations: Abdomen is soft.  Musculoskeletal:        General: Normal range of motion.     Cervical back: Normal range of motion.  Skin:    General: Skin is warm.   Neurological:     General: No focal deficit present.  Psychiatric:        Mood and Affect: Mood normal.     ED Results / Procedures / Treatments   Labs (all labs ordered are listed, but only abnormal results are displayed) Labs Reviewed  URINALYSIS, ROUTINE W REFLEX MICROSCOPIC - Abnormal; Notable for the following components:  Result Value   APPearance HAZY (*)    Hgb urine dipstick LARGE (*)    Protein, ur 30 (*)    Leukocytes,Ua TRACE (*)    All other components within normal limits  URINALYSIS, MICROSCOPIC (REFLEX) - Abnormal; Notable for the following components:   Bacteria, UA RARE (*)    All other components within normal limits    EKG None  Radiology No results found.  Procedures Procedures (including critical care time)  Medications Ordered in ED Medications  HYDROmorphone (DILAUDID) injection 0.5 mg (0.5 mg Intramuscular Given 03/03/20 1956)    ED Course  I have reviewed the triage vital signs and the nursing notes.  Pertinent labs & imaging results that were available during my care of the patient were reviewed by me and considered in my medical decision making (see chart for details).    MDM Rules/Calculators/A&P                          MDM: Ct scan shows an 71mm stone in left distal ureter with hydronephrosis.  Ua shows blood.  Pt reports she has pain medication at home.  Pt request nausea medication.  Pt given miralax du to concern of constipation.  Pt advised to call urology on Monday to schedule to be seen  Final Clinical Impression(s) / ED Diagnoses Final diagnoses:  Renal colic on left side    Rx / DC Orders ED Discharge Orders         Ordered    polyethylene glycol (MIRALAX) 17 g packet  Daily        03/03/20 2129    promethazine (PHENERGAN) 25 MG tablet  Every 6 hours PRN        03/03/20 2129        An After Visit Summary was printed and given to the patient.    Osie Cheeks 03/03/20 2130    Pollyann Savoy,  MD 03/03/20 2231

## 2020-03-03 NOTE — ED Notes (Signed)
Patient pain improved 4/10. Blood pressure still elevated at 192/92. [place], [place] is informed.

## 2020-03-03 NOTE — ED Notes (Signed)
Patient in 7/10 pain. Blood pressure is 195/86 after taking it twice. Cheron Schaumann, [place] is made aware.

## 2020-03-03 NOTE — ED Notes (Signed)
Patient

## 2020-03-03 NOTE — ED Notes (Signed)
Patient taken to CT.

## 2020-03-06 ENCOUNTER — Emergency Department (HOSPITAL_COMMUNITY): Payer: Medicare Other

## 2020-03-06 ENCOUNTER — Other Ambulatory Visit: Payer: Self-pay

## 2020-03-06 ENCOUNTER — Emergency Department (HOSPITAL_COMMUNITY)
Admission: EM | Admit: 2020-03-06 | Discharge: 2020-03-06 | Disposition: A | Discharge status: Home / Self Care | Payer: Medicare Other | Attending: Emergency Medicine | Admitting: Emergency Medicine

## 2020-03-06 ENCOUNTER — Encounter (HOSPITAL_COMMUNITY): Payer: Self-pay

## 2020-03-06 DIAGNOSIS — Z9011 Acquired absence of right breast and nipple: Secondary | ICD-10-CM | POA: Insufficient documentation

## 2020-03-06 DIAGNOSIS — Z79899 Other long term (current) drug therapy: Secondary | ICD-10-CM | POA: Insufficient documentation

## 2020-03-06 DIAGNOSIS — Z7982 Long term (current) use of aspirin: Secondary | ICD-10-CM | POA: Diagnosis not present

## 2020-03-06 DIAGNOSIS — N200 Calculus of kidney: Secondary | ICD-10-CM | POA: Insufficient documentation

## 2020-03-06 DIAGNOSIS — E039 Hypothyroidism, unspecified: Secondary | ICD-10-CM | POA: Insufficient documentation

## 2020-03-06 DIAGNOSIS — R1110 Vomiting, unspecified: Secondary | ICD-10-CM | POA: Insufficient documentation

## 2020-03-06 DIAGNOSIS — R102 Pelvic and perineal pain: Secondary | ICD-10-CM | POA: Diagnosis present

## 2020-03-06 HISTORY — DX: Disorder of kidney and ureter, unspecified: N28.9

## 2020-03-06 LAB — CBC WITH DIFFERENTIAL/PLATELET
Abs Immature Granulocytes: 0.02 10*3/uL (ref 0.00–0.07)
Basophils Absolute: 0.1 10*3/uL (ref 0.0–0.1)
Basophils Relative: 1 %
Eosinophils Absolute: 0.6 10*3/uL — ABNORMAL HIGH (ref 0.0–0.5)
Eosinophils Relative: 8 %
HCT: 40.2 % (ref 36.0–46.0)
Hemoglobin: 13 g/dL (ref 12.0–15.0)
Immature Granulocytes: 0 %
Lymphocytes Relative: 18 %
Lymphs Abs: 1.3 10*3/uL (ref 0.7–4.0)
MCH: 29.4 pg (ref 26.0–34.0)
MCHC: 32.3 g/dL (ref 30.0–36.0)
MCV: 91 fL (ref 80.0–100.0)
Monocytes Absolute: 0.7 10*3/uL (ref 0.1–1.0)
Monocytes Relative: 10 %
Neutro Abs: 4.4 10*3/uL (ref 1.7–7.7)
Neutrophils Relative %: 63 %
Platelets: 188 10*3/uL (ref 150–400)
RBC: 4.42 MIL/uL (ref 3.87–5.11)
RDW: 12.8 % (ref 11.5–15.5)
WBC: 7.2 10*3/uL (ref 4.0–10.5)
nRBC: 0 % (ref 0.0–0.2)

## 2020-03-06 LAB — URINALYSIS, ROUTINE W REFLEX MICROSCOPIC
Bacteria, UA: NONE SEEN
Bilirubin Urine: NEGATIVE
Glucose, UA: NEGATIVE mg/dL
Ketones, ur: NEGATIVE mg/dL
Leukocytes,Ua: NEGATIVE
Nitrite: NEGATIVE
Protein, ur: NEGATIVE mg/dL
RBC / HPF: >50 RBC/hpf — ABNORMAL HIGH (ref 0–5)
Specific Gravity, Urine: 1.021 (ref 1.005–1.030)
pH: 5 (ref 5.0–8.0)

## 2020-03-06 LAB — BASIC METABOLIC PANEL
Anion gap: 11 (ref 5–15)
BUN: 30 mg/dL — ABNORMAL HIGH (ref 8–23)
CO2: 27 mmol/L (ref 22–32)
Calcium: 9.1 mg/dL (ref 8.9–10.3)
Chloride: 102 mmol/L (ref 98–111)
Creatinine, Ser: 1.06 mg/dL — ABNORMAL HIGH (ref 0.44–1.00)
GFR, Estimated: 58 mL/min — ABNORMAL LOW (ref >60)
Glucose, Bld: 98 mg/dL (ref 70–99)
Potassium: 3.6 mmol/L (ref 3.5–5.1)
Sodium: 140 mmol/L (ref 135–145)

## 2020-03-06 MED ORDER — LACTATED RINGERS IV BOLUS
500.0000 mL | Freq: Once | INTRAVENOUS | Status: AC
  Administered 2020-03-06: 500 mL via INTRAVENOUS

## 2020-03-06 MED ORDER — OXYCODONE-ACETAMINOPHEN 5-325 MG PO TABS
2.0000 | ORAL_TABLET | Freq: Once | ORAL | Status: AC
  Administered 2020-03-06: 2 via ORAL
  Filled 2020-03-06: qty 2

## 2020-03-06 MED ORDER — HYDROMORPHONE HCL 1 MG/ML IJ SOLN
1.0000 mg | Freq: Once | INTRAMUSCULAR | Status: AC
  Administered 2020-03-06: 1 mg via INTRAVENOUS
  Filled 2020-03-06: qty 1

## 2020-03-06 MED ORDER — ONDANSETRON HCL 4 MG/2ML IJ SOLN
4.0000 mg | Freq: Once | INTRAMUSCULAR | Status: AC
  Administered 2020-03-06: 4 mg via INTRAVENOUS
  Filled 2020-03-06: qty 2

## 2020-03-06 MED ORDER — LACTATED RINGERS IV SOLN: INTRAVENOUS | Status: DC

## 2020-03-06 MED ORDER — HYDROMORPHONE HCL 1 MG/ML IJ SOLN
0.5000 mg | INTRAMUSCULAR | Status: DC | PRN
  Filled 2020-03-06: qty 1

## 2020-03-06 NOTE — ED Notes (Signed)
Assisted pt to restroom, pt able to urinate without issue. Urine volume approx 

## 2020-03-06 NOTE — ED Triage Notes (Signed)
Patient reports that she has a known 8 mm kidney stone and states she is having pelvic pain and voiding frequent small amounts.

## 2020-03-06 NOTE — ED Provider Notes (Signed)
Beverly Perry   CSN: [id number] Arrival date & time: 03/06/20  1531     History Chief Complaint  Patient presents with  . Pelvic Pain    Beverly Perry is a 64 y.o. female.  HPI Patient was diagnosed with 8 mm kidney stone in the left distal ureter with mild left hydronephrosis on 11\26\2021.  Patient reports she was having severe pain at that time.  She reports it was significantly improved while in the emergency department and treated with Dilaudid.  She reports that she has been passing blood clots.  Pain was controlled for the first day.  However over the weekend she started eating a lot of pressure and pain in her left back and suprapubic area.  She was trying tramadol at home but not getting much relief.  She called [facility] this morning as instructed.  She was told that Dr. Ronne BinningMcKenzie no longer practice there and they would call her back within 24 hours.  Patient reports that she was having increasing pain and could not wait another 24 hours for treatment.  Reports she did start vomiting today.  She has not had fever or chills.  No shortness of breath no chest pain.    Past Medical History:  Diagnosis Date  . Anxiety   . Arthritis   . Breast mass, right   . Colon polyp   . Complication of anesthesia   . Depression   . Fatty liver   . Graves disease   . Hyperlipidemia   . Hyperthyroidism    had thyroid irradiated with radioactive iodine  . 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 Osteoporosis   . PONV (postoperative nausea and vomiting)   . Positive H. pylori test   . Raynaud's syndrome   . Renal disorder   . Stroke [facility])    ? CVA 3 yrs ago  . UTI (urinary tract infection)     There are no problems to display for this patient.   Past Surgical History:  Procedure Laterality Date  . ABDOMINAL HYSTERECTOMY    . BREAST EXCISIONAL BIOPSY Right 2017  . BREAST LUMPECTOMY WITH RADIOACTIVE SEED  LOCALIZATION Right 03/22/2016   Procedure: RIGHT BREAST LUMPECTOMY WITH RADIOACTIVE SEED LOCALIZATION;  Surgeon: Chevis PrettyPaul Toth III, MD;  Location: [facility];  Service: General;  Laterality: Right;  . CHOLECYSTECTOMY    . COLONOSCOPY  11/30/2014   Colonic polyp status post polypectomy. Mild colonic diverticulosis.   Marland Kitchen. PARATHYROIDECTOMY       OB History   No obstetric history on file.     Family History  Problem Relation Age of Onset  . Prostate cancer Father   . Crohn's disease Father   . Breast cancer Sister   . Colon polyps Sister   . Cirrhosis Sister        non alcoholic cirrhosis  . Colon cancer Neg Hx        PATIENT DOESN'T KNOW MOM'S SIDE OF MEDICAL HISTORY   . Esophageal cancer Neg Hx   . Rectal cancer Neg Hx   . Stomach cancer Neg Hx     Social History   Tobacco Use  . Smoking status: Never Smoker  . Smokeless tobacco: Never Used  Vaping Use  . Vaping Use: Never used  Substance Use Topics  . Alcohol use: Yes    Comment: OCASSIONALLY  . Drug use: No    Home Medications Prior to Admission medications  Medication Sig Start Date End Date Taking? Authorizing Provider  acetaminophen (TYLENOL) 500 MG tablet Take 1,000 mg by mouth every 6 (six) hours as needed.   Yes [provider]  aspirin 325 MG tablet Take 325 mg by mouth daily.   Yes [provider]  buPROPion (WELLBUTRIN XL) 150 MG 24 hr tablet Take 450 mg by mouth daily.   Yes [provider]  calcium carbonate (OS-CAL - DOSED IN MG OF ELEMENTAL CALCIUM) 1250 (500 Ca) MG tablet Take 1 tablet by mouth daily.  02/28/14  Yes [provider]  Calcium Carbonate Antacid (TUMS PO) Take 1 tablet by mouth daily.   Yes [provider]  cholecalciferol (VITAMIN D) 1000 units tablet Take 1,000 Units by mouth daily.   Yes [provider]  clonazePAM (KLONOPIN) 0.5 MG tablet Take 0.5 mg by mouth 2 (two) times daily as needed for anxiety.   Yes [provider]  denosumab (PROLIA) 60 MG/ML SOLN injection Inject 60 mg into the skin every 6 (six) months.    Yes [provider]  dicyclomine (BENTYL) 10 MG capsule Take 1 capsule (10 mg total) by mouth 4 (four) times daily as needed for spasms. 06/08/18  Yes Lynann Bologna, MD  fenofibrate micronized (LOFIBRA) 200 MG capsule Take 200 mg by mouth daily. 12/22/19  Yes [provider]  fluticasone furoate-vilanterol (BREO ELLIPTA) 100-25 MCG/INH AEPB Inhale 1 puff into the lungs daily. 02/23/20  Yes Olalere, Adewale A, MD  levothyroxine (SYNTHROID, LEVOTHROID) 150 MCG tablet Take 150 mcg by mouth daily.   Yes [provider]  nitrofurantoin, macrocrystal-monohydrate, (MACROBID) 100 MG capsule Take 100 mg by mouth 2 (two) times daily. Start date : 02/28/20 02/28/20  Yes [provider]  Omega-3 Fatty Acids (FISH OIL) 1000 MG CAPS Take 1,000 mg by mouth daily.    Yes [provider]  ondansetron (ZOFRAN) 8 MG tablet Take 8 mg by mouth every 8 (eight) hours as needed for nausea or vomiting.  02/05/20  Yes [provider]  polyethylene glycol (MIRALAX) 17 g packet Take 17 g by mouth daily. 03/03/20  Yes Elson Areas, PA-C  promethazine (PHENERGAN) 25 MG tablet Take 1 tablet (25 mg total) by mouth every 6 (six) hours as needed for nausea or vomiting. 03/03/20  Yes Cheron Schaumann K, PA-C  traMADol (ULTRAM) 50 MG tablet Take 50 mg by mouth 2 (two) times daily as needed for moderate pain.  03/01/20  Yes [provider]  venlafaxine XR (EFFEXOR-XR) 150 MG 24 hr capsule Take 150 mg by mouth daily. 01/31/20  Yes [provider]  VENTOLIN HFA 108 (90 Base) MCG/ACT inhaler Inhale 2 puffs into the lungs every 6 (six) hours as needed for wheezing or shortness of breath.  11/29/19  Yes [provider]  VIT B12-METHIONINE-INOS-CHOL IM Inject 1 each into the muscle every 30 (thirty) days.    Yes [provider]    Allergies     Patient has no known allergies.  Review of Systems   Review of Systems 10 systems reviewed and negative except as per HPI Physical Exam Updated Vital Signs BP (!) 147/85   Pulse 64   Temp 98.7 F (37.1 C) (Oral)   Resp 18   Ht 5\' 2"  (1.575 m)   Wt 63 kg   SpO2 96%   BMI 25.42 kg/m   Physical Exam Constitutional:      Appearance: She is well-developed.  HENT:     Head:  Normocephalic and atraumatic.  Cardiovascular:     Rate and Rhythm: Normal rate and regular rhythm.     Heart sounds: Normal heart sounds.  Pulmonary:     Effort: Pulmonary effort is normal.     Breath sounds: Normal breath sounds.  Abdominal:     General: Bowel sounds are normal. There is no distension.     Palpations: Abdomen is soft.     Tenderness: There is abdominal tenderness.     Comments: Mild to moderate suprapubic tenderness to palpation.  No guarding.  No firm palpable mass, bladder does not feel distended  Musculoskeletal:        General: Normal range of motion.     Cervical back: Neck supple.  Skin:    General: Skin is warm and dry.  Neurological:     Mental Status: She is alert and oriented to person, place, and time.     GCS: GCS eye subscore is 4. GCS verbal subscore is 5. GCS motor subscore is 6.     Coordination: Coordination normal.  Psychiatric:        Mood and Affect: Mood normal.     ED Results / Procedures / Treatments   Labs (all labs ordered are listed, but only abnormal results are displayed) Labs Reviewed  URINALYSIS, ROUTINE W REFLEX MICROSCOPIC - Abnormal; Notable for the following components:      Result Value   APPearance HAZY (*)    Hgb urine dipstick LARGE (*)    RBC / HPF >50 (*)    All other components within normal limits  BASIC METABOLIC PANEL - Abnormal; Notable for the following components:   BUN 30 (*)    Creatinine, Ser 1.06 (*)    GFR, Estimated 58 (*)    All other components within normal limits  CBC WITH DIFFERENTIAL/PLATELET - Abnormal; Notable  for the following components:   Eosinophils Absolute 0.6 (*)    All other components within normal limits    EKG None  Radiology US Renal  Result Date: 03/06/2020 CLINICAL DATA:  History of distal left ureteral stone on recent CT examination EXAM: RENAL / URINARY TRACT ULTRASOUND COMPLETE COMPARISON:  03/03/2020 FINDINGS: Right Kidney: Renal measurements: 11.6 x 5.2 x 6.1 cm. = volume: 194 mL. Echogenicity within normal limits. No mass or hydronephrosis visualized. Left Kidney: Renal measurements: 12.5 x 7.0 x 7.4 cm. = volume: 339 mL. Moderate hydronephrosis is noted on the left similar to that seen on the prior exam. Scattered small calcifications are noted consistent with nonobstructing stones similar to that noted on prior CT examination. Bladder: Decompressed Other: None. IMPRESSION: Persistent left-sided hydronephrosis similar to that seen on recent CT examination. Nonobstructing left renal calculi. No other focal abnormality is noted. Electronically Signed   By: Alcide Clever M.D.   On: 03/06/2020 19:36    Procedures Procedures (including critical care time)  Medications Ordered in ED Medications  lactated ringers infusion (has no administration in time range)  HYDROmorphone (DILAUDID) injection 0.5 mg (has no administration in time range)  lactated ringers bolus 500 mL (500 mLs Intravenous New Bag/Given (Non-Interop) 03/06/20 1935)  HYDROmorphone (DILAUDID) injection 1 mg (1 mg Intravenous Given 03/06/20 1931)  ondansetron (ZOFRAN) injection 4 mg (4 mg Intravenous Given 03/06/20 1930)    ED Course  I have reviewed the triage vital signs and the nursing notes.  Pertinent labs & imaging results that were available during my care of the patient were reviewed by me and considered in my medical decision  making (see chart for details).  Clinical Course as of Mar 06 2122  Mon Mar 06, 2020  1957 Pain significantly improved.comfortable   [MP]  2121 Delay in placement of urology  consult call.  Clerk paging now.   [MP]    Clinical Course User Index [MP] Arby Barrette, MD   MDM Rules/Calculators/A&P                          Patient with retained kidney stone.  She had significant rebound pain.  Patient's pain now control.  Consultation has been placed with urology.  Plan is in place for patient to be seen by urology on the following day for stenting and stone retrieval.  Patient is comfortable with this plan. Final Clinical Impression(s) / ED Diagnoses Final diagnoses:  Kidney stone on left side    Rx / DC Orders ED Discharge Orders    None       Arby Barrette, MD 03/17/20 1627

## 2020-03-06 NOTE — ED Notes (Signed)
Bladder scanned pt 3x shows 10ml

## 2020-03-06 NOTE — Discharge Instructions (Addendum)
1.  You should be called tomorrow morning by [facility] for scheduling for your kidney stone removal and stent.  Your case was reviewed in the emergency department with Dr. Estrellita Ludwig. 2.  Do not eat after midnight tonight.  If you need additional pain medication.  You may take your tramadol at home with a sip of water.

## 2020-03-07 ENCOUNTER — Ambulatory Visit (HOSPITAL_COMMUNITY): Payer: Medicare Other

## 2020-03-07 ENCOUNTER — Ambulatory Visit (HOSPITAL_COMMUNITY): Payer: Medicare Other | Admitting: Anesthesiology

## 2020-03-07 ENCOUNTER — Encounter (HOSPITAL_COMMUNITY)
Admission: RE | Disposition: A | Discharge status: Home / Self Care | Payer: Self-pay | Source: Home / Self Care | Attending: Urology

## 2020-03-07 ENCOUNTER — Ambulatory Visit (HOSPITAL_COMMUNITY)
Admission: RE | Admit: 2020-03-07 | Discharge: 2020-03-07 | Disposition: A | Discharge status: Home / Self Care | Payer: Medicare Other | Attending: Urology | Admitting: Urology

## 2020-03-07 ENCOUNTER — Other Ambulatory Visit: Payer: Self-pay

## 2020-03-07 ENCOUNTER — Other Ambulatory Visit: Payer: Self-pay | Admitting: Urology

## 2020-03-07 DIAGNOSIS — Z7951 Long term (current) use of inhaled steroids: Secondary | ICD-10-CM | POA: Diagnosis not present

## 2020-03-07 DIAGNOSIS — Z8673 Personal history of transient ischemic attack (TIA), and cerebral infarction without residual deficits: Secondary | ICD-10-CM | POA: Diagnosis not present

## 2020-03-07 DIAGNOSIS — Z841 Family history of disorders of kidney and ureter: Secondary | ICD-10-CM | POA: Insufficient documentation

## 2020-03-07 DIAGNOSIS — Z9049 Acquired absence of other specified parts of digestive tract: Secondary | ICD-10-CM | POA: Diagnosis not present

## 2020-03-07 DIAGNOSIS — I739 Peripheral vascular disease, unspecified: Secondary | ICD-10-CM | POA: Insufficient documentation

## 2020-03-07 DIAGNOSIS — Z8042 Family history of malignant neoplasm of prostate: Secondary | ICD-10-CM | POA: Insufficient documentation

## 2020-03-07 DIAGNOSIS — Z7982 Long term (current) use of aspirin: Secondary | ICD-10-CM | POA: Diagnosis not present

## 2020-03-07 DIAGNOSIS — Z20822 Contact with and (suspected) exposure to covid-19: Secondary | ICD-10-CM | POA: Diagnosis not present

## 2020-03-07 DIAGNOSIS — N201 Calculus of ureter: Secondary | ICD-10-CM | POA: Diagnosis not present

## 2020-03-07 DIAGNOSIS — Z79899 Other long term (current) drug therapy: Secondary | ICD-10-CM | POA: Insufficient documentation

## 2020-03-07 DIAGNOSIS — N200 Calculus of kidney: Secondary | ICD-10-CM

## 2020-03-07 HISTORY — PX: CYSTOSCOPY WITH RETROGRADE PYELOGRAM, URETEROSCOPY AND STENT PLACEMENT: SHX5789

## 2020-03-07 LAB — SARS CORONAVIRUS 2 BY RT PCR (HOSPITAL ORDER, PERFORMED IN ~~LOC~~ HOSPITAL LAB): SARS Coronavirus 2: NEGATIVE

## 2020-03-07 SURGERY — CYSTOURETEROSCOPY, WITH RETROGRADE PYELOGRAM AND STENT INSERTION
Anesthesia: General | Laterality: Left

## 2020-03-07 MED ORDER — ONDANSETRON HCL 4 MG/2ML IJ SOLN
INTRAMUSCULAR | Status: DC | PRN
  Administered 2020-03-07: 4 mg via INTRAVENOUS

## 2020-03-07 MED ORDER — MEPERIDINE HCL 50 MG/ML IJ SOLN: 6.2500 mg | INTRAMUSCULAR | Status: DC | PRN

## 2020-03-07 MED ORDER — FENTANYL CITRATE (PF) 100 MCG/2ML IJ SOLN: 25.0000 ug | INTRAMUSCULAR | Status: DC | PRN

## 2020-03-07 MED ORDER — OXYCODONE HCL 5 MG/5ML PO SOLN: 5.0000 mg | Freq: Once | ORAL | Status: AC | PRN

## 2020-03-07 MED ORDER — EPHEDRINE 5 MG/ML INJ
INTRAVENOUS | Status: AC
  Filled 2020-03-07: qty 10

## 2020-03-07 MED ORDER — LACTATED RINGERS IV SOLN: INTRAVENOUS | Status: DC | PRN

## 2020-03-07 MED ORDER — SCOPOLAMINE 1 MG/3DAYS TD PT72
1.0000 | MEDICATED_PATCH | TRANSDERMAL | Status: DC
  Administered 2020-03-07: 1.5 mg via TRANSDERMAL

## 2020-03-07 MED ORDER — DEXAMETHASONE SODIUM PHOSPHATE 10 MG/ML IJ SOLN
INTRAMUSCULAR | Status: DC | PRN
  Administered 2020-03-07: 10 mg via INTRAVENOUS

## 2020-03-07 MED ORDER — SCOPOLAMINE 1 MG/3DAYS TD PT72
MEDICATED_PATCH | TRANSDERMAL | Status: AC
  Filled 2020-03-07: qty 1

## 2020-03-07 MED ORDER — PROPOFOL 10 MG/ML IV BOLUS
INTRAVENOUS | Status: DC | PRN
  Administered 2020-03-07: 120 mg via INTRAVENOUS

## 2020-03-07 MED ORDER — EPHEDRINE SULFATE-NACL 50-0.9 MG/10ML-% IV SOSY
PREFILLED_SYRINGE | INTRAVENOUS | Status: DC | PRN
  Administered 2020-03-07 (×3): 10 mg via INTRAVENOUS

## 2020-03-07 MED ORDER — CHLORHEXIDINE GLUCONATE 0.12 % MT SOLN
15.0000 mL | OROMUCOSAL | Status: AC
  Administered 2020-03-07: 15 mL via OROMUCOSAL

## 2020-03-07 MED ORDER — LACTATED RINGERS IV SOLN: Freq: Once | INTRAVENOUS | Status: AC

## 2020-03-07 MED ORDER — DEXAMETHASONE SODIUM PHOSPHATE 10 MG/ML IJ SOLN
INTRAMUSCULAR | Status: AC
  Filled 2020-03-07: qty 1

## 2020-03-07 MED ORDER — OXYCODONE HCL 5 MG PO TABS
ORAL_TABLET | ORAL | Status: AC
  Administered 2020-03-07: 5 mg via ORAL
  Filled 2020-03-07: qty 1

## 2020-03-07 MED ORDER — FENTANYL CITRATE (PF) 100 MCG/2ML IJ SOLN
INTRAMUSCULAR | Status: DC | PRN
  Administered 2020-03-07: 50 ug via INTRAVENOUS
  Administered 2020-03-07 (×2): 25 ug via INTRAVENOUS

## 2020-03-07 MED ORDER — LIDOCAINE 2% (20 MG/ML) 5 ML SYRINGE
INTRAMUSCULAR | Status: DC | PRN
  Administered 2020-03-07: 60 mg via INTRAVENOUS

## 2020-03-07 MED ORDER — CIPROFLOXACIN HCL 500 MG PO TABS: 500.0000 mg | ORAL_TABLET | Freq: Once | ORAL | 0 refills | Status: AC

## 2020-03-07 MED ORDER — PHENAZOPYRIDINE HCL 200 MG PO TABS: 200.0000 mg | ORAL_TABLET | Freq: Three times a day (TID) | ORAL | 0 refills | Status: DC | PRN

## 2020-03-07 MED ORDER — OXYCODONE HCL 5 MG PO TABS: 5.0000 mg | ORAL_TABLET | Freq: Once | ORAL | Status: AC | PRN

## 2020-03-07 MED ORDER — TRAMADOL HCL 50 MG PO TABS: 50.0000 mg | ORAL_TABLET | Freq: Two times a day (BID) | ORAL | 0 refills | Status: DC | PRN

## 2020-03-07 MED ORDER — ACETAMINOPHEN 325 MG PO TABS: 325.0000 mg | ORAL_TABLET | ORAL | Status: DC | PRN

## 2020-03-07 MED ORDER — MIDAZOLAM HCL 5 MG/5ML IJ SOLN
INTRAMUSCULAR | Status: DC | PRN
  Administered 2020-03-07: 2 mg via INTRAVENOUS

## 2020-03-07 MED ORDER — LIDOCAINE HCL (PF) 2 % IJ SOLN
INTRAMUSCULAR | Status: AC
  Filled 2020-03-07: qty 5

## 2020-03-07 MED ORDER — MIDAZOLAM HCL 2 MG/2ML IJ SOLN
INTRAMUSCULAR | Status: AC
  Filled 2020-03-07: qty 2

## 2020-03-07 MED ORDER — IOHEXOL 300 MG/ML  SOLN
INTRAMUSCULAR | Status: DC | PRN
  Administered 2020-03-07: 9 mL

## 2020-03-07 MED ORDER — CEFAZOLIN SODIUM-DEXTROSE 2-4 GM/100ML-% IV SOLN
2.0000 g | Freq: Once | INTRAVENOUS | Status: AC
  Administered 2020-03-07: 2 g via INTRAVENOUS
  Filled 2020-03-07: qty 100

## 2020-03-07 MED ORDER — FENTANYL CITRATE (PF) 100 MCG/2ML IJ SOLN
INTRAMUSCULAR | Status: AC
  Filled 2020-03-07: qty 2

## 2020-03-07 MED ORDER — ONDANSETRON HCL 4 MG/2ML IJ SOLN: 4.0000 mg | Freq: Once | INTRAMUSCULAR | Status: DC | PRN

## 2020-03-07 MED ORDER — ONDANSETRON HCL 4 MG/2ML IJ SOLN
INTRAMUSCULAR | Status: AC
  Filled 2020-03-07: qty 2

## 2020-03-07 MED ORDER — SODIUM CHLORIDE 0.9 % IR SOLN
Status: DC | PRN
  Administered 2020-03-07: 3000 mL

## 2020-03-07 MED ORDER — ACETAMINOPHEN 160 MG/5ML PO SOLN: 325.0000 mg | ORAL | Status: DC | PRN

## 2020-03-07 SURGICAL SUPPLY — 20 items
BAG URO CATCHER STRL LF (MISCELLANEOUS) ×3 IMPLANT
BASKET ZERO TIP NITINOL 2.4FR (BASKET) IMPLANT
BSKT STON RTRVL ZERO TP 2.4FR (BASKET)
CATH URET 5FR 28IN OPEN ENDED (CATHETERS) ×3 IMPLANT
CLOTH BEACON ORANGE TIMEOUT ST (SAFETY) ×3 IMPLANT
EXTRACTOR STONE 1.7FRX115CM (UROLOGICAL SUPPLIES) ×2 IMPLANT
GLOVE BIOGEL M STRL SZ7.5 (GLOVE) ×3 IMPLANT
GOWN STRL REUS W/TWL XL LVL3 (GOWN DISPOSABLE) ×3 IMPLANT
GUIDEWIRE ANG ZIPWIRE 038X150 (WIRE) IMPLANT
GUIDEWIRE STR DUAL SENSOR (WIRE) ×3 IMPLANT
KIT TURNOVER KIT A (KITS) IMPLANT
LASER FIB FLEXIVA PULSE ID 365 (Laser) ×2 IMPLANT
MANIFOLD NEPTUNE II (INSTRUMENTS) ×3 IMPLANT
PACK CYSTO (CUSTOM PROCEDURE TRAY) ×3 IMPLANT
SHEATH URETERAL 12FRX28CM (UROLOGICAL SUPPLIES) IMPLANT
SHEATH URETERAL 12FRX35CM (MISCELLANEOUS) IMPLANT
STENT URET 6FRX24 CONTOUR (STENTS) ×2 IMPLANT
TUBING CONNECTING 10 (TUBING) ×2 IMPLANT
TUBING CONNECTING 10' (TUBING) ×1
TUBING UROLOGY SET (TUBING) ×3 IMPLANT

## 2020-03-07 NOTE — Interval H&P Note (Signed)
History and Physical Interval Note:  03/07/2020 4:39 PM  Beverly Perry  has presented today for surgery, with the diagnosis of left distal ureteral stone.  The various methods of treatment have been discussed with the patient and family. After consideration of risks, benefits and other options for treatment, the patient has consented to  Procedure(s): CYSTOSCOPY WITH LEFT RETROGRADE PYELOGRAM, URETEROSCOPY, STONE EXTRACTION POSSIBLE LASER  AND STENT PLACEMENT (Left) as a surgical intervention.  The patient's history has been reviewed, patient examined, no change in status, stable for surgery.  I have reviewed the patient's chart and labs.  Questions were answered to the patient's satisfaction.     Crist Fat

## 2020-03-07 NOTE — Anesthesia Procedure Notes (Signed)
Procedure Name: LMA Insertion Date/Time: 03/07/2020 5:50 PM Performed by: Loc Feinstein D, CRNA Pre-anesthesia Checklist: Patient identified, Emergency Drugs available, Suction available and Patient being monitored Patient Re-evaluated:Patient Re-evaluated prior to induction Oxygen Delivery Method: Circle system utilized Preoxygenation: Pre-oxygenation with 100% oxygen Induction Type: IV induction Ventilation: Mask ventilation without difficulty LMA: LMA inserted LMA Size: 3.0 Tube type: Oral Number of attempts: 1 Placement Confirmation: positive ETCO2 and breath sounds checked- equal and bilateral Tube secured with: Tape Dental Injury: Teeth and Oropharynx as per pre-operative assessment

## 2020-03-07 NOTE — H&P (Signed)
Acute Kidney Stone  HPI: Beverly Perry is a 64 year-old female patient who is here for further eval and management of kidney stones.  She was diagnosed with a kidney stone on approximately 02/29/2020. The patient presented to [facility] with symptoms of a kidney stone.   Her pain started about approximately 02/29/2020. The pain is on both sides.   The patient underwent CT scan prior to today's appointment.   The patient relates initially having nausea, vomiting, flank pain, and voiding symptoms. She is currently having flank pain, back pain, and nausea. She denies having groin pain, vomiting, fever, chills, and voiding symptoms. She has not caught a stone in her urine strainer since her symptoms began.   She has never had surgical treatment for calculi in the past. This is not her first kidney stone. She has had 1 stones prior to getting this one.   Patient found to have an 8 mm left UVJ stone. Her pain is reasonably well controlled, but she is complaining of significant pressure great intermittently has severe pain.     ALLERGIES: None   MEDICATIONS: Aspirin 325 mg tablet  Acetaminophen 500 mg capsule  Breo Ellipta 100 mcg-25 mcg/dose blister, with inhalation device  Bupropion Xl 150 mg tablet, extended release 24 hr  Calcium Carbonate  Clonazepam 0.5 mg tablet  Dicyclomine Hcl 10 mg capsule  Fenofibrate 200 mg capsule  Fish Oil  Levothyroxine 150 mcg capsule  Miralax 17 gram powder in packet  Nitrofurantoin 100 mg capsule  Ondansetron Hcl 8 mg tablet  Prolia 60 mg/ml syringe  Promethazine Hcl 25 mg tablet  Tramadol Hcl 50 mg tablet  Tums  Venlafaxine Hcl Er 150 mg capsule, ext release 24 hr  Ventolin Hfa 90 mcg hfa aerosol with adapter  Vitamin B12  Vitamin D     GU PSH: None     PSH Notes: parathyroid surgery (2010)   NON-GU PSH: Remove Gallbladder, 2001     GU PMH: None   NON-GU PMH: Anxiety Depression Hypercholesterolemia Hyperthyroidism Hypothyroidism     FAMILY HISTORY: 1 Daughter - Daughter 1 son - Son nephrolithiasis - Father Prostate Cancer - Father    Notes: Mother deceased  Father deceased   SOCIAL HISTORY: Marital Status: Married Preferred Language: English; Ethnicity: Not Hispanic Or Latino Current Smoking Status: Patient has never smoked.   Tobacco Use Assessment Completed: Used Tobacco in last 30 days? Has never drank.  Drinks 2 caffeinated drinks per day. Patient's occupation is/was Retired.    REVIEW OF SYSTEMS:    GU Review Female:   Patient reports frequent urination, burning /pain with urination, get up at night to urinate, leakage of urine, stream starts and stops, trouble starting your stream, and have to strain to urinate. Patient denies hard to postpone urination and being pregnant.  Gastrointestinal (Upper):   Patient reports nausea and vomiting. Patient denies indigestion/ heartburn.  Gastrointestinal (Lower):   Patient reports constipation. Patient denies diarrhea.  Constitutional:   Patient denies fever, night sweats, weight loss, and fatigue.  Skin:   Patient denies skin rash/ lesion and itching.  Eyes:   Patient denies blurred vision and double vision.  Ears/ Nose/ Throat:   Patient denies sore throat and sinus problems.  Hematologic/Lymphatic:   Patient denies swollen glands and easy bruising.  Cardiovascular:   Patient denies leg swelling and chest pains.  Respiratory:   Patient reports shortness of breath. Patient denies cough.  Endocrine:   Patient denies excessive thirst.  Musculoskeletal:   Patient  reports back pain. Patient denies joint pain.  Neurological:   Patient denies headaches and dizziness.  Psychologic:   Patient reports depression and anxiety.    VITAL SIGNS:      03/07/2020 11:04 AM  Weight 141 lb / 63.96 kg  Height 62 in / 157.48 cm  BP 135/83 mmHg  Pulse 61 /min  Temperature 98.0 F / 36.6 C  BMI 25.8 kg/m   MULTI-SYSTEM PHYSICAL EXAMINATION:    Constitutional: Well-nourished.  No physical deformities. Normally developed. Good grooming.  Neck: Neck symmetrical, not swollen. Normal tracheal position.  Respiratory: Normal breath sounds. No labored breathing, no use of accessory muscles.   Cardiovascular: Regular rate and rhythm. No murmur, no gallop. Normal temperature, normal extremity pulses, no swelling, no varicosities.   Lymphatic: No enlargement of neck, axillae, groin.  Skin: No paleness, no jaundice, no cyanosis. No lesion, no ulcer, no rash.  Neurologic / Psychiatric: Oriented to time, oriented to place, oriented to person. No depression, no anxiety, no agitation.  Gastrointestinal: No mass, no tenderness, no rigidity, non obese abdomen. Left CVA tenderness  Eyes: Normal conjunctivae. Normal eyelids.  Ears, Nose, Mouth, and Throat: Left ear no scars, no lesions, no masses. Right ear no scars, no lesions, no masses. Nose no scars, no lesions, no masses. Normal hearing. Normal lips.  Musculoskeletal: Normal gait and station of head and neck.     Complexity of Data:  Source Of History:  Patient  Records Review:   Previous Doctor Records, Previous Patient Records  Urine Test Review:   Urinalysis   PROCEDURES:         KUB - 42706  A single view of the abdomen is obtained. Renal shadows are easily visualized bilaterally. There are no stones appreciated within the expected location in either renal pelvis. There are no additional calcifications along the expected location of either ureter bilaterally.  Gas pattern is grossly normal. No significant bony abnormalities.      Impression: The patient's stone is radiolucent and not readily apparent on her KUB today.  Patient confirmed No Neulasta OnPro Device.     ASSESSMENT:      ICD-10 Details  1 GU:   Ureteral calculus - N20.1    PLAN:           Orders X-Rays: KUB          Schedule         Document Letter(s):  Created for Patient: Clinical Summary         Notes:   Patient has a left distal  ureteral stone that is 8 mm and unlikely to pass and a reasonably quick time. We discussed treatment options including medical management versus ureteroscopy. She has opted to proceed with ureteroscopy. Given the distal nature I think it is reasonable to try to do this on call. Our plan is to proceed to the operating room urgently for ureteroscopy left laser lithotripsy stone extraction and stent placement. I went through the surgery with her in detail she agreed to proceed.

## 2020-03-07 NOTE — Op Note (Signed)
Preoperative diagnosis: left ureteral calculus  Postoperative diagnosis: left ureteral calculus  Procedure:  1. Cystoscopy 2. left ureteroscopy and stone removal 3. Ureteroscopic laser lithotripsy 4. left 18F x 24cm ureteral stent placement  5. left retrograde pyelography with interpretation  Surgeon: Crist Fat, MD  Anesthesia: General  Complications: None  Intraoperative findings: left retrograde pyelography demonstrated a filling defect within the left ureter consistent with the patient's known calculus without other abnormalities.  EBL: Minimal  Specimens: 1. left ureteral calculus  Disposition of specimens: [facility] for stone analysis  Indication: Beverly Perry is a 64 y.o.   patient with a left ureteral stone and associated left symptoms. After reviewing the management options for treatment, the patient elected to proceed with the above surgical procedure(s). We have discussed the potential benefits and risks of the procedure, side effects of the proposed treatment, the likelihood of the patient achieving the goals of the procedure, and any potential problems that might occur during the procedure or recuperation. Informed consent has been obtained.   Description of procedure:  The patient was taken to the operating room and general anesthesia was induced.  The patient was placed in the dorsal lithotomy position, prepped and draped in the usual sterile fashion, and preoperative antibiotics were administered. A preoperative time-out was performed.   Cystourethroscopy was performed.  The patient's urethra was examined and was normal. The bladder was then systematically examined in its entirety. There was no evidence for any bladder tumors, stones, or other mucosal pathology.    Attention then turned to the left ureteral orifice and a ureteral catheter was used to intubate the ureteral orifice.  Omnipaque contrast was injected through the ureteral  catheter and a retrograde pyelogram was performed with findings as dictated above.  A 0.38 sensor guidewire was then advanced up the left ureter into the renal pelvis under fluoroscopic guidance. The 6 Fr semirigid ureteroscope was then advanced into the ureter next to the guidewire and the calculus was identified.   The stone was then fragmented with the 365 micron holmium laser fiber on a setting of 1.0 and frequency of 10 Hz.   All stones were then removed from the ureter with an N-gage nitinol basket.  Reinspection of the ureter revealed no remaining visible stones or fragments.   The wire was then backloaded through the cystoscope and a ureteral stent was advance over the wire using Seldinger technique.  The stent was positioned appropriately under fluoroscopic and cystoscopic guidance.  The wire was then removed with an adequate stent curl noted in the renal pelvis as well as in the bladder.  The bladder was then emptied and the procedure ended.  The patient appeared to tolerate the procedure well and without complications.  The patient was able to be awakened and transferred to the recovery unit in satisfactory condition.   Disposition: The tether of the stent was left on and tucked inside the patient's vagina.  Instructions for removing the stent have been provided to the patient. The patient has been scheduled for followup in 6 weeks with a renal ultrasound.

## 2020-03-07 NOTE — Addendum Note (Signed)
Addended by: Crist Fat on: 03/07/2020 12:50 PM   Modules accepted: Orders

## 2020-03-07 NOTE — Anesthesia Preprocedure Evaluation (Addendum)
Anesthesia Evaluation  Patient identified by MRN, date of birth, ID band Patient awake    Reviewed: Allergy & Precautions, NPO status , Patient's Chart, lab work & pertinent test results  History of Anesthesia Complications (+) PONV and history of anesthetic complications  Airway Mallampati: II  TM Distance: >3 FB Neck ROM: Full    Dental no notable dental hx.    Pulmonary neg pulmonary ROS,    Pulmonary exam normal breath sounds clear to auscultation       Cardiovascular + Peripheral Vascular Disease  negative cardio ROS Normal cardiovascular exam Rhythm:Regular Rate:Normal     Neuro/Psych PSYCHIATRIC DISORDERS Anxiety Depression CVA negative psych ROS   GI/Hepatic negative GI ROS, Neg liver ROS,   Endo/Other  negative endocrine ROSHyperthyroidism   Renal/GU negative Renal ROS     Musculoskeletal  (+) Arthritis , Osteoarthritis,    Abdominal   Peds  Hematology negative hematology ROS (+)   Anesthesia Other Findings   Reproductive/Obstetrics negative OB ROS                             Anesthesia Physical  Anesthesia Plan  ASA: III  Anesthesia Plan: General   Post-op Pain Management:    Induction: Intravenous  PONV Risk Score and Plan: 4 or greater and Ondansetron, Dexamethasone, Treatment may vary due to age or medical condition and Midazolam  Airway Management Planned: LMA  Additional Equipment: None  Intra-op Plan:   Post-operative Plan: Extubation in OR  Informed Consent: I have reviewed the patients History and Physical, chart, labs and discussed the procedure including the risks, benefits and alternatives for the proposed anesthesia with the patient or authorized representative who has indicated his/her understanding and acceptance.     Dental advisory given  Plan Discussed with: CRNA and Anesthesiologist  Anesthesia Plan Comments:         Anesthesia  Quick Evaluation

## 2020-03-07 NOTE — Transfer of Care (Signed)
Immediate Anesthesia Transfer of Care Note  Patient: Beverly Perry  Procedure(s) Performed: CYSTOSCOPY WITH LEFT RETROGRADE PYELOGRAM, URETEROSCOPY, STONE EXTRACTION,LASER  AND STENT PLACEMENT (Left )  Patient Location: PACU  Anesthesia Type:General  Level of Consciousness: awake, alert  and oriented  Airway & Oxygen Therapy: Patient Spontanous Breathing and Patient connected to face mask oxygen  Post-op Assessment: Report given to RN and Post -op Vital signs reviewed and stable  Post vital signs: Reviewed and stable  Last Vitals:  Vitals Value Taken Time  BP    Temp    Pulse 80 03/07/20 1831  Resp 14 03/07/20 1831  SpO2 98 % 03/07/20 1831  Vitals shown include unvalidated device data.  Last Pain:  Vitals:   03/07/20 1509  TempSrc: Oral  PainSc: 8       Patients Stated Pain Goal: 2 (03/07/20 1509)  Complications: No complications documented.

## 2020-03-07 NOTE — Anesthesia Postprocedure Evaluation (Signed)
Anesthesia Post Note  Patient: Beverly Perry  Procedure(s) Performed: CYSTOSCOPY WITH LEFT RETROGRADE PYELOGRAM, URETEROSCOPY, STONE EXTRACTION,LASER  AND STENT PLACEMENT (Left )     Patient location during evaluation: PACU Anesthesia Type: General Level of consciousness: awake and alert Pain management: pain level controlled Vital Signs Assessment: post-procedure vital signs reviewed and stable Respiratory status: spontaneous breathing, nonlabored ventilation, respiratory function stable and patient connected to nasal cannula oxygen Cardiovascular status: blood pressure returned to baseline and stable Postop Assessment: no apparent nausea or vomiting Anesthetic complications: no   No complications documented.  Last Vitals:  Vitals:   03/07/20 1915 03/07/20 1949  BP: (!) 141/71 133/62  Pulse: 74 69  Resp: 13 17  Temp: 36.9 C 36.9 C  SpO2: 92% 93%    Last Pain:  Vitals:   03/07/20 1949  TempSrc:   PainSc: 2                  Kennieth Rad

## 2020-03-07 NOTE — Discharge Instructions (Signed)
DISCHARGE INSTRUCTIONS FOR KIDNEY STONE/URETERAL STENT   MEDICATIONS:  1.  Resume all your other meds from home - except do not take any extra narcotic pain meds that you may have at home.  2. Pyridium is to help with the burning/stinging when you urinate. 3. Tramadol is for moderate/severe pain, otherwise taking upto 1000 mg every 6 hours of plainTylenol will help treat your pain.   4. Take Cipro one hour prior to removal of your stent.   ACTIVITY:  1. No strenuous activity x 1week  2. No driving while on narcotic pain medications  3. Drink plenty of water  4. Continue to walk at home - you can still get blood clots when you are at home, so keep active, but don't over do it.  5. May return to work/school tomorrow or when you feel ready   BATHING:  1. You can shower and we recommend daily showers  2. You have a string coming from your urethra: The stent string is attached to your ureteral stent. Do not pull on this.   SIGNS/SYMPTOMS TO CALL:  Please call us if you have a fever greater than 101.5, uncontrolled nausea/vomiting, uncontrolled pain, dizziness, unable to urinate, bloody urine, chest pain, shortness of breath, leg swelling, leg pain, redness around wound, drainage from wound, or any other concerns or questions.   You can reach [place] at [phone].   FOLLOW-UP:  1. You have an appointment in 6 weeks with a ultrasound of your kidneys prior.  2. You have a string attached to your stent, you may remove it on Monday, Dec 6th. To do this, pull the strings until the stents are completely removed. You may feel an odd sensation in your back.

## 2020-03-08 ENCOUNTER — Encounter (HOSPITAL_COMMUNITY): Payer: Self-pay | Admitting: Urology

## 2020-03-13 ENCOUNTER — Ambulatory Visit (INDEPENDENT_AMBULATORY_CARE_PROVIDER_SITE_OTHER): Payer: Medicare Other

## 2020-03-13 ENCOUNTER — Encounter: Payer: Self-pay | Admitting: Physician Assistant

## 2020-03-13 ENCOUNTER — Ambulatory Visit (INDEPENDENT_AMBULATORY_CARE_PROVIDER_SITE_OTHER): Payer: Medicare Other | Admitting: Physician Assistant

## 2020-03-13 DIAGNOSIS — S4291XA Fracture of right shoulder girdle, part unspecified, initial encounter for closed fracture: Secondary | ICD-10-CM

## 2020-03-13 NOTE — Progress Notes (Signed)
Office Visit Note   Patient: Beverly Perry           Date of Birth: 22-Jul-1955           MRN: [id number] Visit Date: 03/13/2020              Requested by: Galvin Proffer, MD [address] [place],  [place] [address] PCP: Galvin Proffer, MD   Assessment & Plan: Visit Diagnoses:  1. Shoulder fracture, right, closed, initial encounter     Plan: We will send her to formal physical therapy to work on range of motion strengthening of the right shoulder.  They are to go slowly with her.  Questions encouraged and answered.  She will follow-up with [place] in 1 month we will obtain 3 views of the right shoulder at that time.  Follow-Up Instructions: Return in about 4 weeks (around 04/10/2020) for Radiographs.   Orders:  Orders Placed This Encounter  Procedures  . XR Shoulder Right   No orders of the defined types were placed in this encounter.     Procedures: No procedures performed   Clinical Data: No additional findings.   Subjective: Chief Complaint  Patient presents with  . Right Shoulder - Pain    HPI Beverly Perry returns today follow-up of her right shoulder fracture.  She states that her shoulder really has not been bothering her much and lysis out of the sling for long period of time.  She unfortunately is recovering from kidney stone which she had to have removed.  She states the kidney stone was much worse than her shoulder pain and she really kind of forgot about the shoulder pain.  She underwent a cystoscopy scope with left retrograde of pyelogram ureteroscope and stone extraction laser and stent placement.  Review of Systems See HPI otherwise negative  Objective: Vital Signs: There were no vitals taken for this visit.  Physical Exam Constitutional:      Appearance: She is not ill-appearing or diaphoretic.  Pulmonary:     Effort: Pulmonary effort is normal.  Neurological:     Mental Status: She is alert.  Psychiatric:        Mood and Affect: Mood normal.       Ortho Exam Right shoulder passively and bring her arm full forward flexion.  Gentle external and internal rotation reveals fluid motion with some discomfort.  She has full range of motion of the right elbow forearm wrist and hand. Specialty Comments:  No specialty comments available.  Imaging: XR Shoulder Right  Result Date: 03/13/2020 2 views right shoulder: Fracture remains in excellent position alignment.  Good consolidation seen compared to prior films.  Shoulder is well located.  No other bony abnormalities.    PMFS History: There are no problems to display for this patient.  Past Medical History:  Diagnosis Date  . Anxiety   . Arthritis   . Breast mass, right   . Colon polyp   . Complication of anesthesia   . Depression   . Fatty liver   . Graves disease   . Hyperlipidemia   . Hyperthyroidism    had thyroid irradiated with radioactive iodine  . 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 Osteoporosis   . PONV (postoperative nausea and vomiting)   . Positive H. pylori test   . Raynaud's syndrome   . Renal disorder   . Stroke [facility])    ? CVA 3 yrs ago  . UTI (urinary  tract infection)     Family History  Problem Relation Age of Onset  . Prostate cancer Father   . Crohn's disease Father   . Breast cancer Sister   . Colon polyps Sister   . Cirrhosis Sister        non alcoholic cirrhosis  . Colon cancer Neg Hx        PATIENT DOESN'T KNOW MOM'S SIDE OF MEDICAL HISTORY   . Esophageal cancer Neg Hx   . Rectal cancer Neg Hx   . Stomach cancer Neg Hx     Past Surgical History:  Procedure Laterality Date  . ABDOMINAL HYSTERECTOMY    . BREAST EXCISIONAL BIOPSY Right 2017  . BREAST LUMPECTOMY WITH RADIOACTIVE SEED LOCALIZATION Right 03/22/2016   Procedure: RIGHT BREAST LUMPECTOMY WITH RADIOACTIVE SEED LOCALIZATION;  Surgeon: Chevis Pretty III, MD;  Location: [facility];  Service: General;  Laterality: Right;  . CHOLECYSTECTOMY     . COLONOSCOPY  11/30/2014   Colonic polyp status post polypectomy. Mild colonic diverticulosis.   . CYSTOSCOPY WITH RETROGRADE PYELOGRAM, URETEROSCOPY AND STENT PLACEMENT Left 03/07/2020   Procedure: CYSTOSCOPY WITH LEFT RETROGRADE PYELOGRAM, URETEROSCOPY, STONE EXTRACTION,LASER  AND STENT PLACEMENT;  Surgeon: Crist Fat, MD;  Location: [facility];  Service: Urology;  Laterality: Left;  . PARATHYROIDECTOMY     Social History   Occupational History  . Not on file  Tobacco Use  . Smoking status: Never Smoker  . Smokeless tobacco: Never Used  Vaping Use  . Vaping Use: Never used  Substance and Sexual Activity  . Alcohol use: Yes    Comment: OCASSIONALLY  . Drug use: No  . Sexual activity: Not on file

## 2020-04-10 ENCOUNTER — Ambulatory Visit (INDEPENDENT_AMBULATORY_CARE_PROVIDER_SITE_OTHER): Payer: Medicare Other | Admitting: Physician Assistant

## 2020-04-10 ENCOUNTER — Ambulatory Visit (INDEPENDENT_AMBULATORY_CARE_PROVIDER_SITE_OTHER): Payer: PRIVATE HEALTH INSURANCE

## 2020-04-10 ENCOUNTER — Encounter: Payer: Self-pay | Admitting: Physician Assistant

## 2020-04-10 DIAGNOSIS — S4291XA Fracture of right shoulder girdle, part unspecified, initial encounter for closed fracture: Secondary | ICD-10-CM

## 2020-04-10 NOTE — Progress Notes (Signed)
Office Visit Note   Patient: Tranika Scholler           Date of Birth: 07/14/1955           MRN: [id number] Visit Date: 04/10/2020              Requested by: Galvin Proffer, MD [address] [place],  [place] [address] PCP: Galvin Proffer, MD   Assessment & Plan: Visit Diagnoses:  1. Shoulder fracture, right, closed, initial encounter     Plan: She will continue physical therapy to work on range of motion strengthening.  We will see her back in 4 weeks no radiographs at that time unless clinically indicated.  Questions were encouraged and answered  Follow-Up Instructions: No follow-ups on file.   Orders:  Orders Placed This Encounter  Procedures  . XR Shoulder Right   No orders of the defined types were placed in this encounter.     Procedures: No procedures performed   Clinical Data: No additional findings.   Subjective: Chief Complaint  Patient presents with  . Right Shoulder - Fracture, Follow-up    HPI Mrs. Morneault returns today approximately 2 months status post right shoulder fracture.  She states overall that her pain is improving.  Her range of motion is improving.  She is working with physical therapy twice a week.  Notes that abduction is the one motion that she is having difficulty regaining. Review of Systems See HPI  Objective: Vital Signs: There were no vitals taken for this visit.  Physical Exam General well-developed well-nourished female no acute distress. Ortho Exam Right shoulder she has 5 out of 5 strength with external and internal rotation against resistance.  Pain with impingement testing on the right.  Full forward flexion actively.  Limited abduction right shoulder. Specialty Comments:  No specialty comments available.  Imaging: XR Shoulder Right  Result Date: 04/10/2020 Right shoulder 3 views: Shoulders well located.  Glenohumeral joint appears to be well preserved.  Right proximal humerus and greater tuberosity fracture remains  unchanged overall position alignment.  Further consolidation seen of the fracture site.    PMFS History: There are no problems to display for this patient.  Past Medical History:  Diagnosis Date  . Anxiety   . Arthritis   . Breast mass, right   . Colon polyp   . Complication of anesthesia   . Depression   . Fatty liver   . Graves disease   . Hyperlipidemia   . Hyperthyroidism    had thyroid irradiated with radioactive iodine  . 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 Osteoporosis   . PONV (postoperative nausea and vomiting)   . Positive H. pylori test   . Raynaud's syndrome   . Renal disorder   . Stroke [facility])    ? CVA 3 yrs ago  . UTI (urinary tract infection)     Family History  Problem Relation Age of Onset  . Prostate cancer Father   . Crohn's disease Father   . Breast cancer Sister   . Colon polyps Sister   . Cirrhosis Sister        non alcoholic cirrhosis  . Colon cancer Neg Hx        PATIENT DOESN'T KNOW MOM'S SIDE OF MEDICAL HISTORY   . Esophageal cancer Neg Hx   . Rectal cancer Neg Hx   . Stomach cancer Neg Hx     Past Surgical History:  Procedure Laterality Date  .  ABDOMINAL HYSTERECTOMY    . BREAST EXCISIONAL BIOPSY Right 2017  . BREAST LUMPECTOMY WITH RADIOACTIVE SEED LOCALIZATION Right 03/22/2016   Procedure: RIGHT BREAST LUMPECTOMY WITH RADIOACTIVE SEED LOCALIZATION;  Surgeon: Chevis Pretty III, MD;  Location: [facility];  Service: General;  Laterality: Right;  . CHOLECYSTECTOMY    . COLONOSCOPY  11/30/2014   Colonic polyp status post polypectomy. Mild colonic diverticulosis.   . CYSTOSCOPY WITH RETROGRADE PYELOGRAM, URETEROSCOPY AND STENT PLACEMENT Left 03/07/2020   Procedure: CYSTOSCOPY WITH LEFT RETROGRADE PYELOGRAM, URETEROSCOPY, STONE EXTRACTION,LASER  AND STENT PLACEMENT;  Surgeon: Crist Fat, MD;  Location: [facility];  Service: Urology;  Laterality: Left;  . PARATHYROIDECTOMY     Social History    Occupational History  . Not on file  Tobacco Use  . Smoking status: Never Smoker  . Smokeless tobacco: Never Used  Vaping Use  . Vaping Use: Never used  Substance and Sexual Activity  . Alcohol use: Yes    Comment: OCASSIONALLY  . Drug use: No  . Sexual activity: Not on file

## 2020-04-11 ENCOUNTER — Other Ambulatory Visit: Payer: Self-pay | Admitting: Internal Medicine

## 2020-04-11 DIAGNOSIS — Z1231 Encounter for screening mammogram for malignant neoplasm of breast: Secondary | ICD-10-CM

## 2020-05-08 ENCOUNTER — Encounter: Payer: Self-pay | Admitting: Physician Assistant

## 2020-05-08 ENCOUNTER — Ambulatory Visit (INDEPENDENT_AMBULATORY_CARE_PROVIDER_SITE_OTHER): Payer: Medicare Other | Admitting: Physician Assistant

## 2020-05-08 DIAGNOSIS — S4291XA Fracture of right shoulder girdle, part unspecified, initial encounter for closed fracture: Secondary | ICD-10-CM

## 2020-05-08 NOTE — Progress Notes (Signed)
HPI: Ms. Capri returns today approximately 3 months status post right proximal humerus fracture.  She states she is overall doing well.  She has some discomfort in the shoulder.  She has been working with physical therapy and has good home exercise program.  Physical exam: Right shoulder she has full forward flexion.  Active and passive internal rotation external rotation causes no discomfort.  She has 5 out of 5 strength with external/internal rotation against resistance bilaterally.  Empty can test is negative bilaterally.  Impression: Right proximal humerus fracture  Plan: She will keep working on range of motion strengthening the shoulder.  I discussed with her that she may have some achiness in the shoulder for next 3 months to even up to a year.  She will follow-up with [place] as needed.  Questions were encouraged and answered.

## 2020-05-29 ENCOUNTER — Inpatient Hospital Stay: Admission: RE | Admit: 2020-05-29 | Payer: PRIVATE HEALTH INSURANCE | Source: Ambulatory Visit

## 2020-07-17 ENCOUNTER — Other Ambulatory Visit: Payer: Self-pay

## 2020-07-17 ENCOUNTER — Ambulatory Visit
Admission: RE | Admit: 2020-07-17 | Discharge: 2020-07-17 | Disposition: A | Discharge status: Home / Self Care | Payer: Medicare Other | Source: Ambulatory Visit | Attending: Internal Medicine | Admitting: Internal Medicine

## 2020-07-17 DIAGNOSIS — Z1231 Encounter for screening mammogram for malignant neoplasm of breast: Secondary | ICD-10-CM

## 2021-04-04 ENCOUNTER — Other Ambulatory Visit: Payer: Self-pay

## 2021-04-04 ENCOUNTER — Emergency Department (HOSPITAL_BASED_OUTPATIENT_CLINIC_OR_DEPARTMENT_OTHER): Payer: Medicare Other

## 2021-04-04 ENCOUNTER — Encounter (HOSPITAL_BASED_OUTPATIENT_CLINIC_OR_DEPARTMENT_OTHER): Payer: Self-pay

## 2021-04-04 ENCOUNTER — Emergency Department (HOSPITAL_BASED_OUTPATIENT_CLINIC_OR_DEPARTMENT_OTHER)
Admission: EM | Admit: 2021-04-04 | Discharge: 2021-04-04 | Disposition: A | Discharge status: Home / Self Care | Payer: Medicare Other | Attending: Emergency Medicine | Admitting: Emergency Medicine

## 2021-04-04 DIAGNOSIS — U071 COVID-19: Secondary | ICD-10-CM | POA: Insufficient documentation

## 2021-04-04 DIAGNOSIS — Z7982 Long term (current) use of aspirin: Secondary | ICD-10-CM | POA: Diagnosis not present

## 2021-04-04 DIAGNOSIS — Z79899 Other long term (current) drug therapy: Secondary | ICD-10-CM | POA: Insufficient documentation

## 2021-04-04 LAB — CBC WITH DIFFERENTIAL/PLATELET
Abs Immature Granulocytes: 0.01 10*3/uL (ref 0.00–0.07)
Basophils Absolute: 0 10*3/uL (ref 0.0–0.1)
Basophils Relative: 1 %
Eosinophils Absolute: 0.3 10*3/uL (ref 0.0–0.5)
Eosinophils Relative: 10 %
HCT: 44.2 % (ref 36.0–46.0)
Hemoglobin: 14.8 g/dL (ref 12.0–15.0)
Immature Granulocytes: 0 %
Lymphocytes Relative: 39 %
Lymphs Abs: 1.3 10*3/uL (ref 0.7–4.0)
MCH: 29.1 pg (ref 26.0–34.0)
MCHC: 33.5 g/dL (ref 30.0–36.0)
MCV: 86.8 fL (ref 80.0–100.0)
Monocytes Absolute: 0.3 10*3/uL (ref 0.1–1.0)
Monocytes Relative: 10 %
Neutro Abs: 1.4 10*3/uL — ABNORMAL LOW (ref 1.7–7.7)
Neutrophils Relative %: 40 %
Platelets: 178 10*3/uL (ref 150–400)
RBC: 5.09 MIL/uL (ref 3.87–5.11)
RDW: 13.1 % (ref 11.5–15.5)
WBC: 3.4 10*3/uL — ABNORMAL LOW (ref 4.0–10.5)
nRBC: 0 % (ref 0.0–0.2)

## 2021-04-04 LAB — BASIC METABOLIC PANEL
Anion gap: 9 (ref 5–15)
BUN: 23 mg/dL (ref 8–23)
CO2: 26 mmol/L (ref 22–32)
Calcium: 8.9 mg/dL (ref 8.9–10.3)
Chloride: 104 mmol/L (ref 98–111)
Creatinine, Ser: 0.78 mg/dL (ref 0.44–1.00)
GFR, Estimated: >60 mL/min (ref >60)
Glucose, Bld: 90 mg/dL (ref 70–99)
Potassium: 3.6 mmol/L (ref 3.5–5.1)
Sodium: 139 mmol/L (ref 135–145)

## 2021-04-04 MED ORDER — GUAIFENESIN ER 600 MG PO TB12: 600.0000 mg | ORAL_TABLET | Freq: Two times a day (BID) | ORAL | 0 refills | Status: DC

## 2021-04-04 MED ORDER — KETOROLAC TROMETHAMINE 30 MG/ML IJ SOLN
30.0000 mg | Freq: Once | INTRAMUSCULAR | Status: AC
  Administered 2021-04-04: 19:00:00 30 mg via INTRAVENOUS
  Filled 2021-04-04: qty 1

## 2021-04-04 MED ORDER — SODIUM CHLORIDE 0.9 % IV BOLUS
1000.0000 mL | Freq: Once | INTRAVENOUS | Status: AC
  Administered 2021-04-04: 19:00:00 1000 mL via INTRAVENOUS

## 2021-04-04 MED ORDER — LOPERAMIDE HCL 2 MG PO CAPS: 2.0000 mg | ORAL_CAPSULE | Freq: Three times a day (TID) | ORAL | 0 refills | Status: DC | PRN

## 2021-04-04 NOTE — ED Notes (Signed)
Pt states that she was sent by her PCP to get chest XR and fluids for her covid r/t diarrhea and coughing with history of asthma

## 2021-04-04 NOTE — ED Provider Notes (Signed)
[facility] EMERGENCY DEPARTMENT Provider Note   CSN: [id number] Arrival date & time: 04/04/21  1437     History Chief Complaint  Patient presents with   Covid Positive    Beverly Perry is a 65 y.o. female.  HPI  65 year old female with past medical history of HTN, HLD, Raynaud's syndrome presents the emergency department with concern for generalized illness attributed to [place].  Patient states she has been sick for the past 6 to 7 days.  She been having generalized weakness, headache, myalgias, cough, diarrhea.  States that she took a home COVID test which was +3 days ago, she was started on Paxlovid by her primary doctor.  She comes in today with ongoing headache and symptoms with decreased appetite.  Denies any chest pain, back pain, swelling of her lower extremities, abdominal pain.  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 pylori test    Raynaud's syndrome    Renal disorder    Stroke [facility])    ? CVA 3 yrs ago   UTI (urinary tract infection)     There are no problems to display for this patient.   Past Surgical History:  Procedure Laterality Date   ABDOMINAL HYSTERECTOMY     BREAST EXCISIONAL BIOPSY Right 2017   BREAST LUMPECTOMY WITH RADIOACTIVE SEED LOCALIZATION Right 03/22/2016   Procedure: RIGHT BREAST LUMPECTOMY WITH RADIOACTIVE SEED LOCALIZATION;  Surgeon: Autumn Messing III, MD;  Location: [facility];  Service: General;  Laterality: Right;   CHOLECYSTECTOMY     COLONOSCOPY  11/30/2014   Colonic polyp status post polypectomy. Mild colonic diverticulosis.    CYSTOSCOPY WITH  RETROGRADE PYELOGRAM, URETEROSCOPY AND STENT PLACEMENT Left 03/07/2020   Procedure: CYSTOSCOPY WITH LEFT RETROGRADE PYELOGRAM, URETEROSCOPY, [place]  AND STENT PLACEMENT;  Surgeon: Ardis Hughs, MD;  Location: [facility];  Service: Urology;  Laterality: Left;   PARATHYROIDECTOMY       OB History   No obstetric history on file.     Family History  Problem Relation Age of Onset   Prostate cancer Father    Crohn's disease Father    Breast cancer Sister    Colon polyps Sister    Cirrhosis Sister        non alcoholic cirrhosis   Colon cancer Neg Hx        PATIENT DOESN'T KNOW MOM'S SIDE OF MEDICAL HISTORY    Esophageal cancer Neg Hx    Rectal cancer Neg Hx    Stomach cancer Neg Hx     Social History   Tobacco Use   Smoking status: Never   Smokeless tobacco: Never  Vaping Use   Vaping Use: Never used  Substance Use Topics   Alcohol use: Yes    Comment: occ   Drug use: No    Home Medications Prior to Admission medications   Medication Sig Start Date End Date Taking? Authorizing Provider  guaiFENesin (MUCINEX) 600 MG 12 hr tablet Take 1 tablet (600 mg total) by mouth 2 (two) times daily. 04/04/21  Yes Juvia Aerts, Alvin Critchley, DO  loperamide (IMODIUM) 2 MG capsule Take 1 capsule (2 mg total) by mouth 3 (three) times daily as needed for diarrhea or loose stools. 04/04/21  Yes Jodell Weitman, Alvin Critchley, DO  acetaminophen (TYLENOL) 500 MG tablet Take 1,000 mg by mouth every 6 (six) hours as needed.    [provider]  aspirin 325 MG tablet Take 325 mg by mouth daily.    [provider]  buPROPion (WELLBUTRIN XL) 150 MG 24 hr tablet Take 450 mg by mouth daily.    [provider]  calcium carbonate (OS-CAL - DOSED IN MG OF ELEMENTAL CALCIUM) 1250 (500 Ca) MG tablet Take 1 tablet by mouth daily.  02/28/14   [provider]  Calcium Carbonate Antacid (TUMS PO) Take 1 tablet by mouth daily.    [provider]  cholecalciferol (VITAMIN D)  1000 units tablet Take 1,000 Units by mouth daily.    [provider]  ciprofloxacin (CIPRO) 500 MG tablet SMARTSIG:1 Tablet(s) By Mouth 03/08/20   [provider]  clonazePAM (KLONOPIN) 0.5 MG tablet Take 0.5 mg by mouth 2 (two) times daily as needed for anxiety.    [provider]  denosumab (PROLIA) 60 MG/ML SOLN injection Inject 60 mg into the skin every 6 (six) months.     [provider]  dicyclomine (BENTYL) 10 MG capsule Take 1 capsule (10 mg total) by mouth 4 (four) times daily as needed for spasms. 06/08/18   Jackquline Denmark, MD  fenofibrate micronized (LOFIBRA) 200 MG capsule Take 200 mg by mouth daily. 12/22/19   [provider]  fluticasone furoate-vilanterol (BREO ELLIPTA) 100-25 MCG/INH AEPB Inhale 1 puff into the lungs daily. 02/23/20   Olalere, Cicero Duck A, MD  levothyroxine (SYNTHROID, LEVOTHROID) 150 MCG tablet Take 150 mcg by mouth daily.    [provider]  nitrofurantoin, macrocrystal-monohydrate, (MACROBID) 100 MG capsule Take 100 mg by mouth 2 (two) times daily. Start date : 02/28/20 02/28/20   [provider]  Omega-3 Fatty Acids (FISH OIL) 1000 MG CAPS Take 1,000 mg by mouth daily.     [provider]  ondansetron (ZOFRAN) 8 MG tablet Take 8 mg by mouth every 8 (eight) hours as needed for nausea or vomiting.  02/05/20   [provider]  phenazopyridine (PYRIDIUM) 200 MG tablet Take 1 tablet (200 mg total) by mouth 3 (three) times daily as needed for pain. 03/07/20   Ardis Hughs, MD  polyethylene glycol (MIRALAX) 17 g packet Take 17 g by mouth daily. 03/03/20   Fransico Meadow, PA-C  polyethylene glycol powder (GLYCOLAX/MIRALAX) 17 GM/SCOOP powder SMARTSIG:17 By Mouth Daily 03/06/20   [provider]  promethazine (PHENERGAN) 25 MG tablet Take 1 tablet (25 mg total) by mouth every 6 (six) hours as needed for nausea or vomiting. 03/03/20   Fransico Meadow, PA-C  traMADol (ULTRAM) 50 MG  tablet Take 1 tablet (50 mg total) by mouth 2 (two) times daily as needed for moderate pain. 03/07/20   Ardis Hughs, MD  venlafaxine XR (EFFEXOR-XR) 150 MG 24 hr capsule Take 150 mg by mouth daily. 01/31/20   [provider]  VENTOLIN HFA 108 (90 Base) MCG/ACT inhaler Inhale 2 puffs into the lungs every 6 (six) hours as needed for wheezing or shortness of breath.  11/29/19   [provider]  VIT B12-METHIONINE-INOS-CHOL IM Inject 1 each into the muscle every 30 (thirty) days.     [provider]  Allergies    Patient has no known allergies.  Review of Systems   Review of Systems  Constitutional:  Positive for appetite change, chills and fatigue. Negative for fever.  HENT:  Negative for congestion.   Eyes:  Negative for visual disturbance.  Respiratory:  Positive for cough and shortness of breath.   Cardiovascular:  Negative for chest pain, palpitations and leg swelling.  Gastrointestinal:  Positive for diarrhea. Negative for abdominal pain and vomiting.  Genitourinary:  Negative for dysuria.  Musculoskeletal:  Positive for myalgias. Negative for back pain and neck pain.  Skin:  Negative for rash.  Neurological:  Positive for headaches.   Physical Exam Updated Vital Signs BP (!) 158/67    Pulse 60    Temp 97.7 F (36.5 C) (Oral)    Resp 18    SpO2 100%   Physical Exam Vitals and nursing note reviewed.  Constitutional:      Appearance: Normal appearance. She is not toxic-appearing.  HENT:     Head: Normocephalic.     Right Ear: External ear normal.     Left Ear: External ear normal.     Mouth/Throat:     Mouth: Mucous membranes are moist.  Cardiovascular:     Rate and Rhythm: Normal rate.  Pulmonary:     Effort: Pulmonary effort is normal. No respiratory distress.     Breath sounds: No wheezing or rales.  Abdominal:     Palpations: Abdomen is soft.     Tenderness: There is no abdominal tenderness.  Musculoskeletal:        General: No  swelling.     Cervical back: No rigidity.  Skin:    General: Skin is warm.  Neurological:     Mental Status: She is alert and oriented to person, place, and time. Mental status is at baseline.  Psychiatric:        Mood and Affect: Mood normal.    ED Results / Procedures / Treatments   Labs (all labs ordered are listed, but only abnormal results are displayed) Labs Reviewed  CBC WITH DIFFERENTIAL/PLATELET - Abnormal; Notable for the following components:      Result Value   WBC 3.4 (*)    Neutro Abs 1.4 (*)    All other components within normal limits  BASIC METABOLIC PANEL    EKG None  Radiology DG Chest Port 1 View  Result Date: 04/04/2021 CLINICAL DATA:  Flu like symptoms since 04/01/2021. EXAM: PORTABLE CHEST 1 VIEW COMPARISON:  01/17/2020 FINDINGS: The cardiac silhouette, mediastinal and hilar contours are within normal limits. The lungs are clear. No infiltrates or effusions. No pulmonary lesions. The bony thorax is intact. IMPRESSION: No acute cardiopulmonary findings. Electronically Signed   By: Marijo Sanes M.D.   On: 04/04/2021 19:17    Procedures Procedures   Medications Ordered in ED Medications  ketorolac (TORADOL) 30 MG/ML injection 30 mg (30 mg Intravenous Given 04/04/21 1855)  sodium chloride 0.9 % bolus 1,000 mL ( Intravenous Stopped 04/04/21 1935)    ED Course  I have reviewed the triage vital signs and the nursing notes.  Pertinent labs & imaging results that were available during my care of the patient were reviewed by me and considered in my medical decision making (see chart for details).    MDM Rules/Calculators/A&P                          65 year old female presents emergency department generalized illness, COVID-positive  5 days ago.  Currently on Paxlovid.  Here with ongoing symptoms, headache, decreased appetite.  Vital signs are stable.  Blood work is reassuring, chest x-ray shows no acute pneumonia.  After IV hydration and medication  headache resolved and she feels improved.  We will continue on Paxlovid and plan for outpatient symptomatic treatment.  Patient at this time appears safe and stable for discharge and will be treated as an outpatient.  Discharge plan and strict return to ED precautions discussed, patient verbalizes understanding and agreement.      Final Clinical Impression(s) / ED Diagnoses Final diagnoses:  COVID    Rx / DC Orders ED Discharge Orders          Ordered    loperamide (IMODIUM) 2 MG capsule  3 times daily PRN        04/04/21 2041    guaiFENesin (MUCINEX) 600 MG 12 hr tablet  2 times daily        04/04/21 2041             Rozelle Logan, DO 04/04/21 2046

## 2021-04-04 NOTE — ED Triage Notes (Signed)
Pt c/o flu like sx started 12/25-tested +covid home test-PCP office did drive up test today that was +covid-advised pt to come to ED-pt NAD-steady gait

## 2021-04-04 NOTE — Discharge Instructions (Addendum)
You have been seen and discharged from the emergency department.  Your blood work is reassuring, chest x-ray showed no pneumonia.  Continue to take Paxil with it.  You may take over-the-counter medication for symptom control including the prescriptions that have been sent for you.  Take Tylenol/ibuprofen as needed for pain/fever control.  Stay well-hydrated.  Follow-up with your primary provider for reevaluation and further care. Take home medications as prescribed. If you have any worsening symptoms or further concerns for your health please return to an emergency department for further evaluation.

## 2021-05-27 IMAGING — DX DG SHOULDER 2+V PORT*R*
2 series · 2 of 2 positions shown · non-contrast
Comparison: None.

CLINICAL DATA: Fall with shoulder pain

EXAM:
PORTABLE RIGHT SHOULDER

[shoulder ap (1 of 2)]
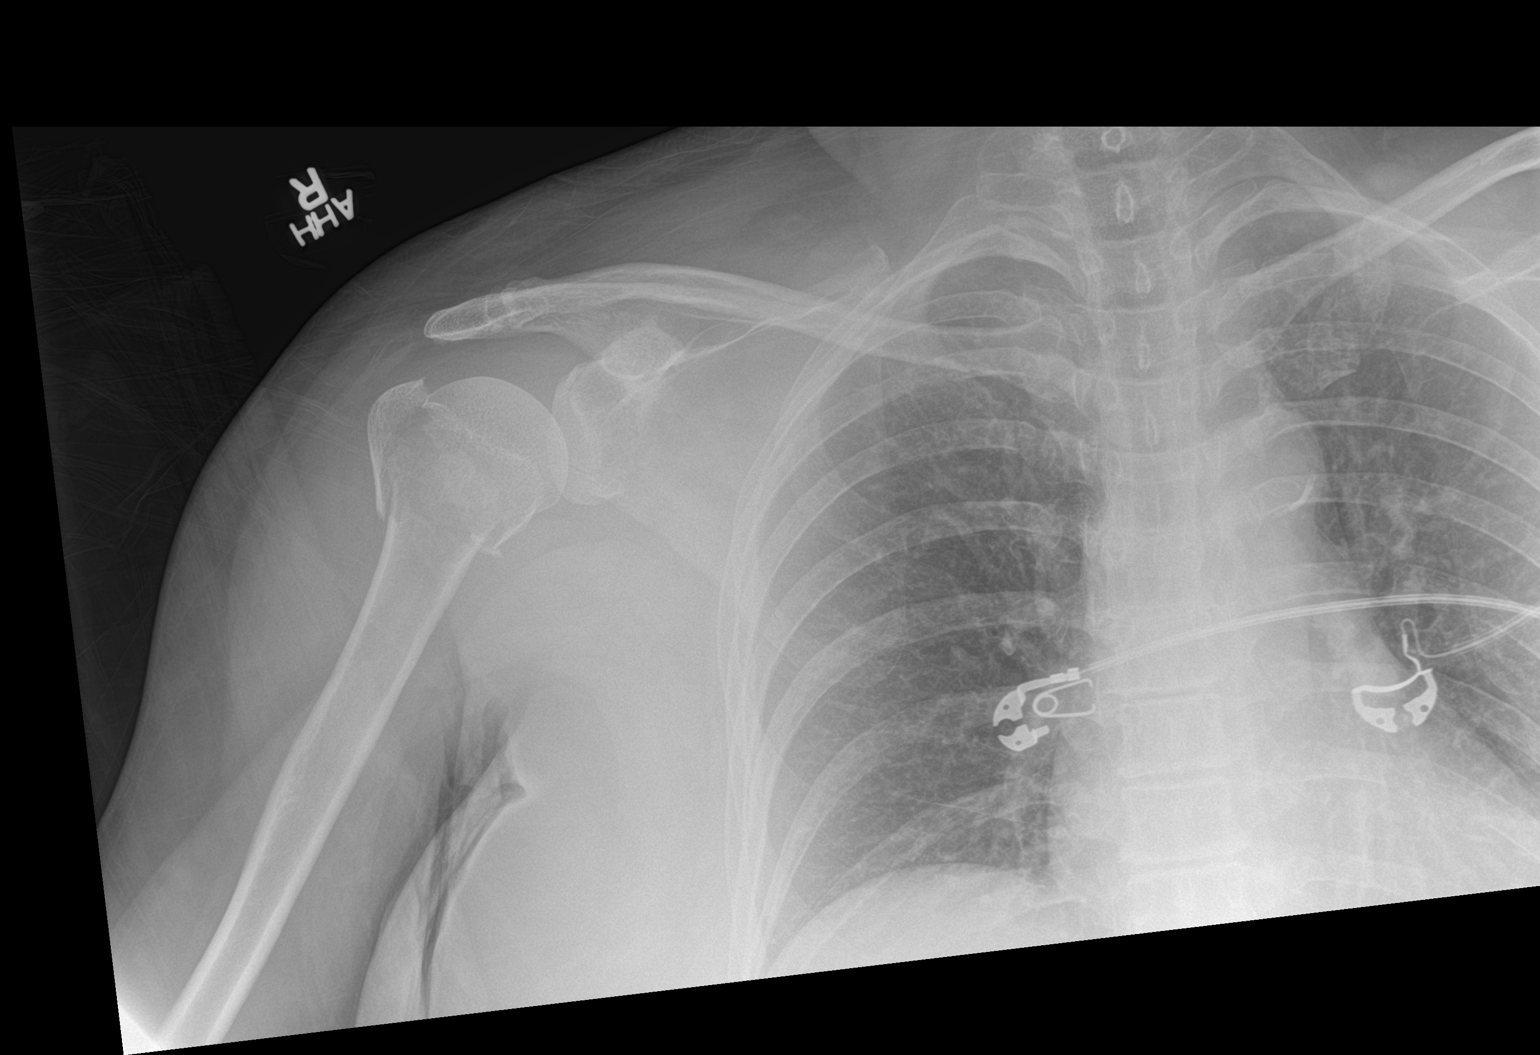

[shoulder ap (2 of 2)]
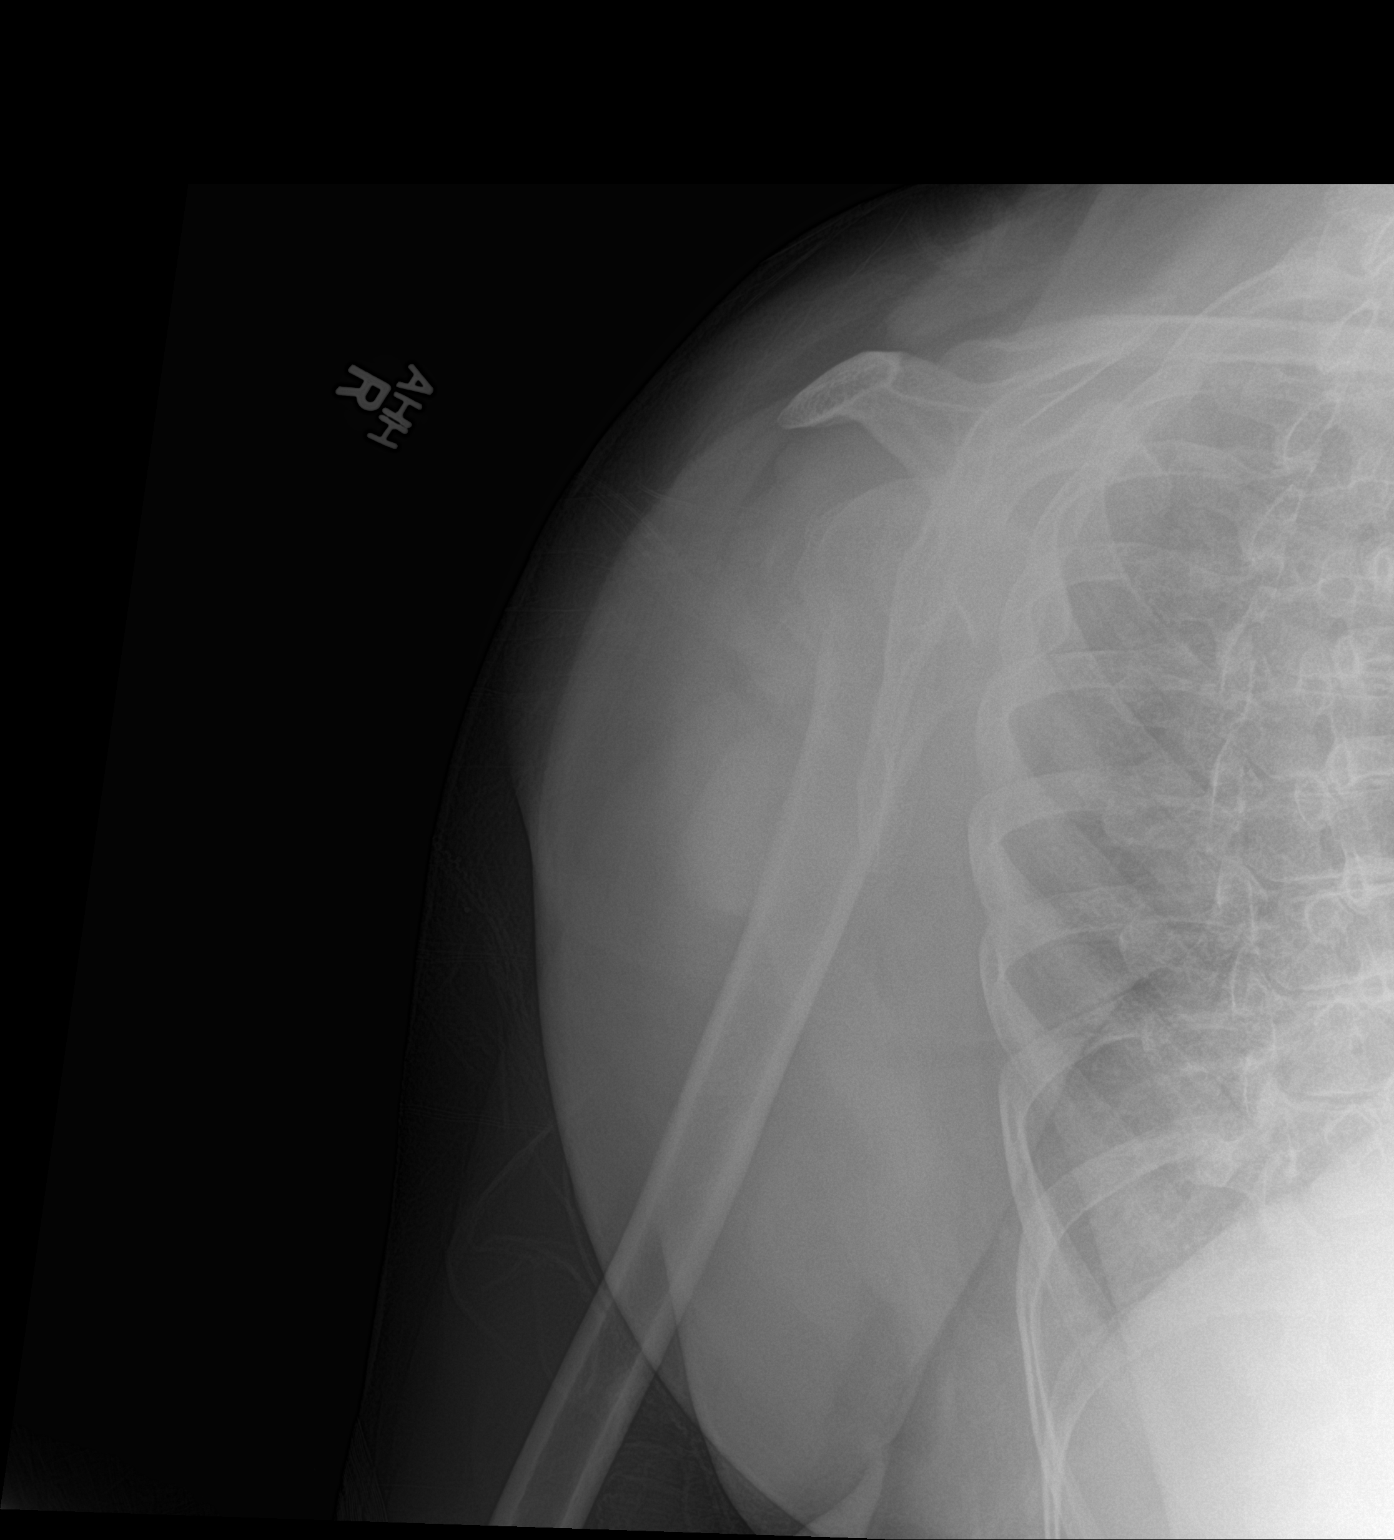

[2 of 2 positions shown; findings below may reference images not displayed]

FINDINGS: Acute mildly comminuted fracture involving the humeral neck and
greater tuberosity. No significant angulation. There is mild
fracture displacement. Humeral head articulates with the glenoid.
IMPRESSION: Acute mildly comminuted and displaced fracture involving the humeral
neck and greater tuberosity.

## 2021-07-31 ENCOUNTER — Other Ambulatory Visit: Payer: Self-pay | Admitting: Internal Medicine

## 2021-07-31 DIAGNOSIS — Z1231 Encounter for screening mammogram for malignant neoplasm of breast: Secondary | ICD-10-CM

## 2021-08-06 ENCOUNTER — Ambulatory Visit
Admission: RE | Admit: 2021-08-06 | Discharge: 2021-08-06 | Disposition: A | Discharge status: Home / Self Care | Payer: Medicare Other | Source: Ambulatory Visit | Attending: Internal Medicine | Admitting: Internal Medicine

## 2021-08-06 DIAGNOSIS — Z1231 Encounter for screening mammogram for malignant neoplasm of breast: Secondary | ICD-10-CM

## 2021-11-07 IMAGING — MG MM DIGITAL SCREENING BILAT W/ TOMO AND CAD
8 series · 8 of 24 positions shown · non-contrast
Comparison: Previous exam(s).

CLINICAL DATA: Screening.

EXAM:
DIGITAL SCREENING BILATERAL MAMMOGRAM WITH TOMOSYNTHESIS AND CAD
TECHNIQUE: Bilateral screening digital craniocaudal and mediolateral oblique
mammograms were obtained. Bilateral screening digital breast
tomosynthesis was performed. The images were evaluated with
computer-aided detection.

[L CC synth-2D]
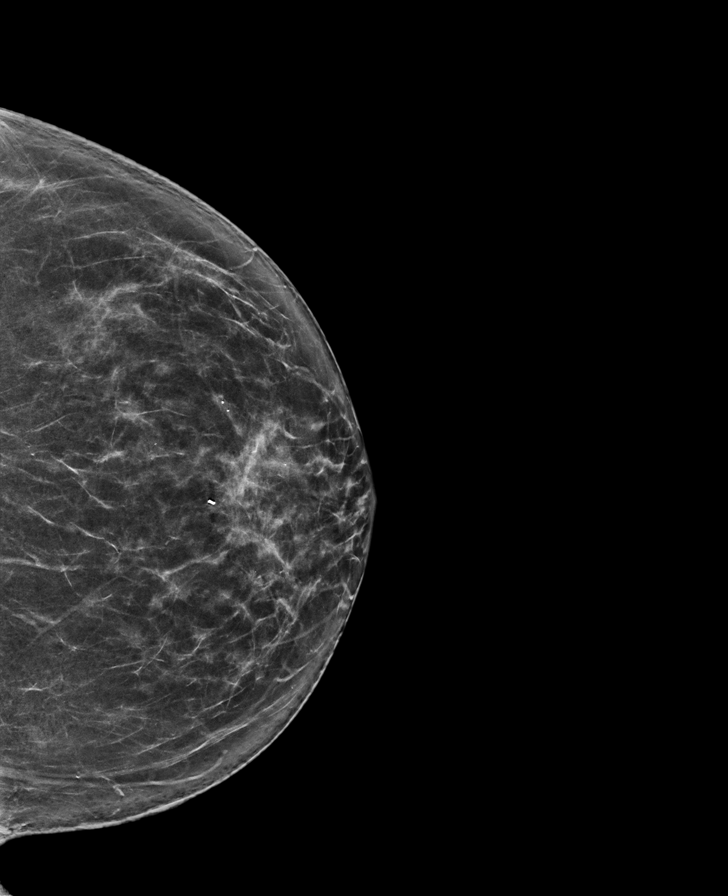

[R MLO synth-2D]
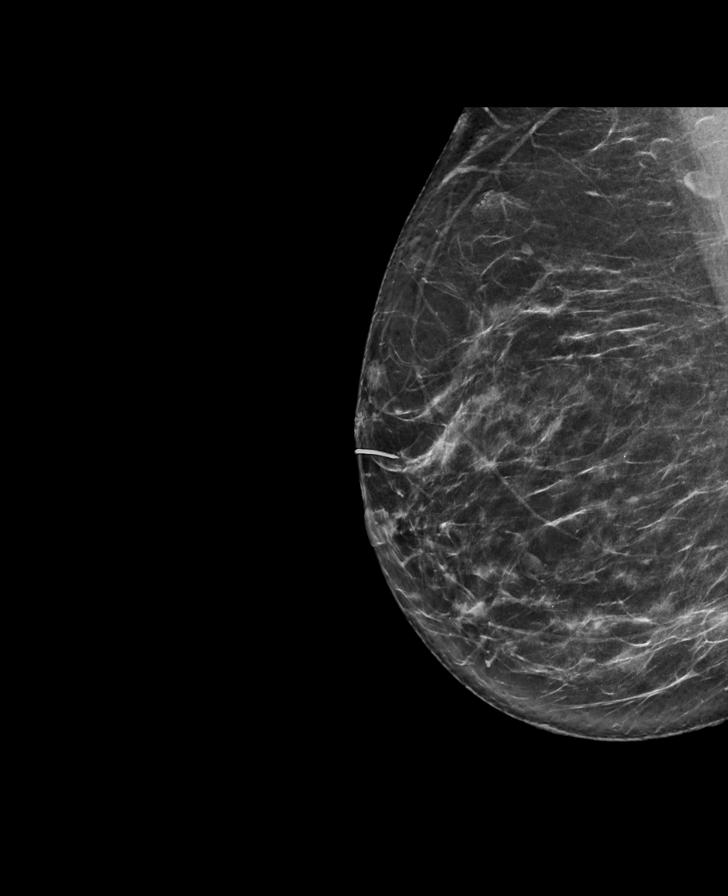

[L MLO synth-2D]
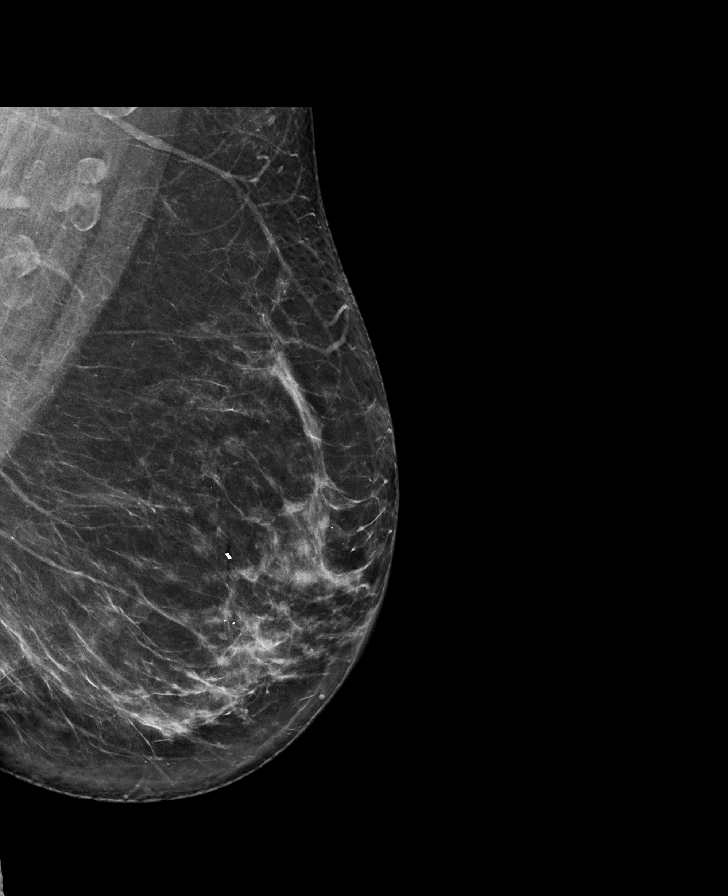

[R CC synth-2D]
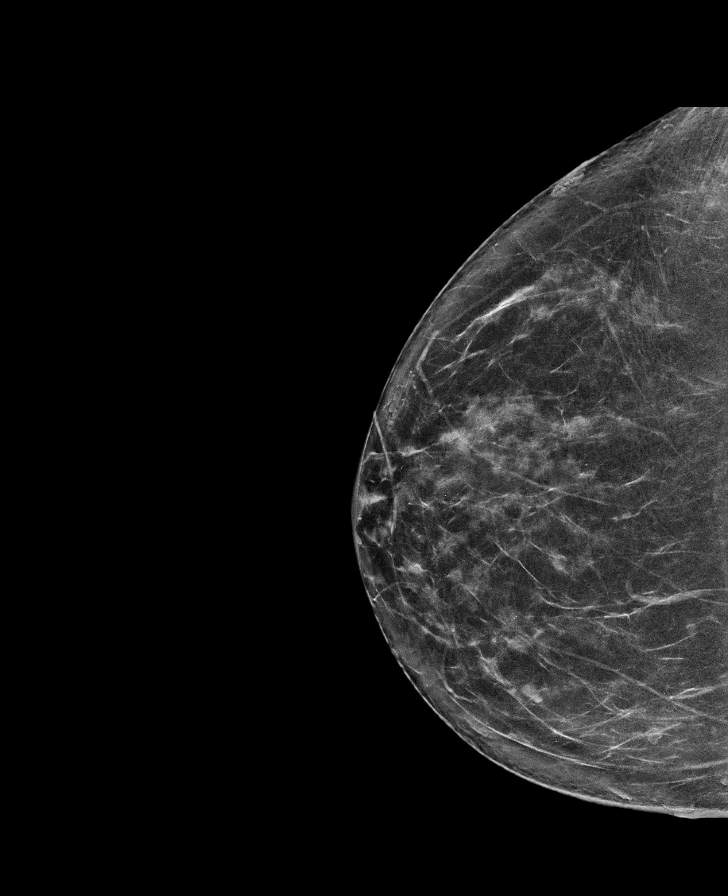

[R CC tomo · tomo slice 39/78.0]
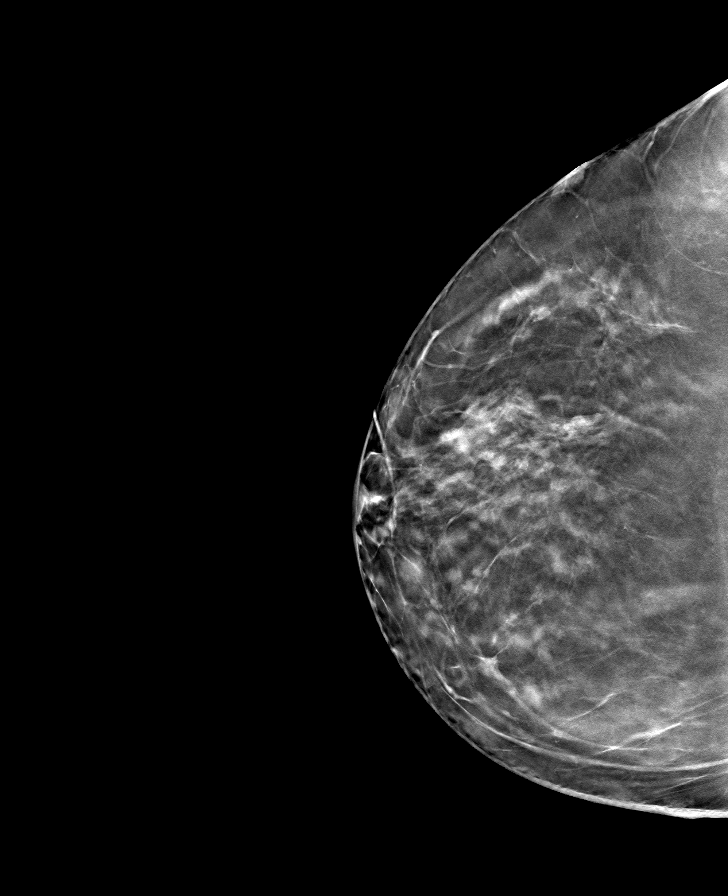

[L MLO tomo · tomo slice 41/81.0]
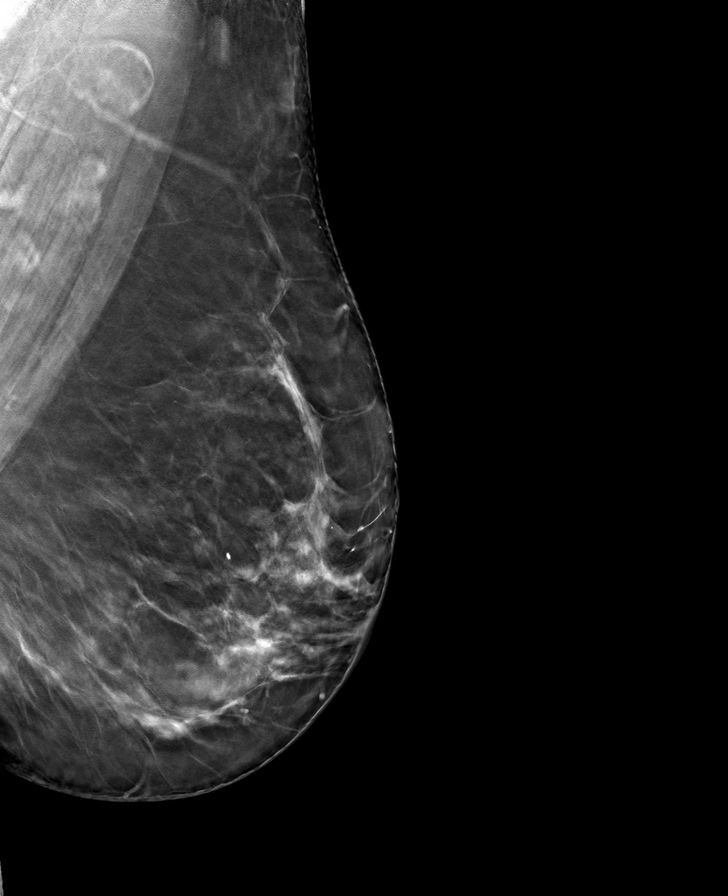

[L CC tomo · tomo slice 37/73.0]
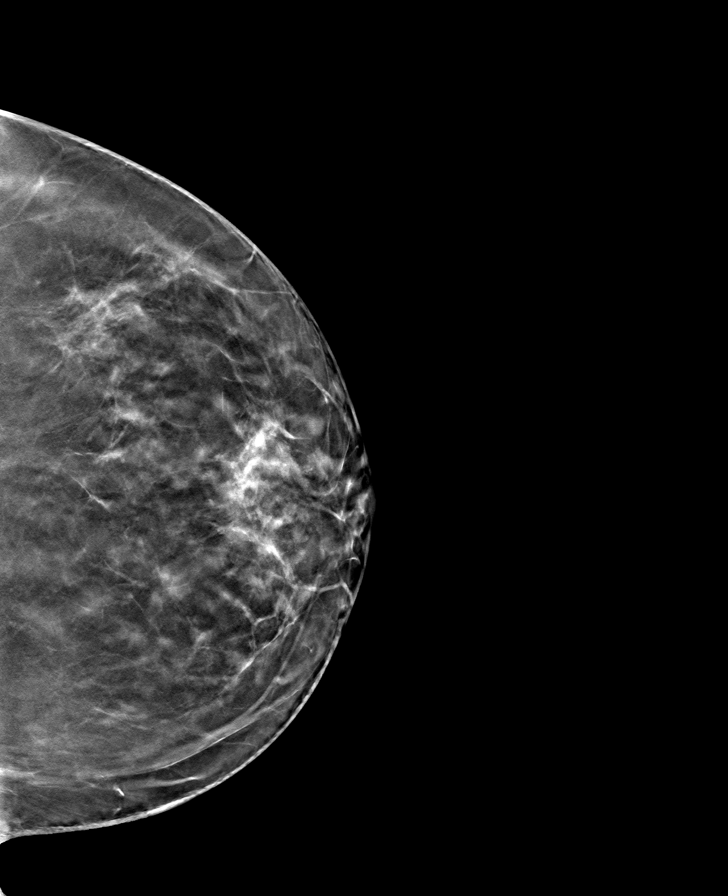

[R MLO tomo · tomo slice 37/74.0]
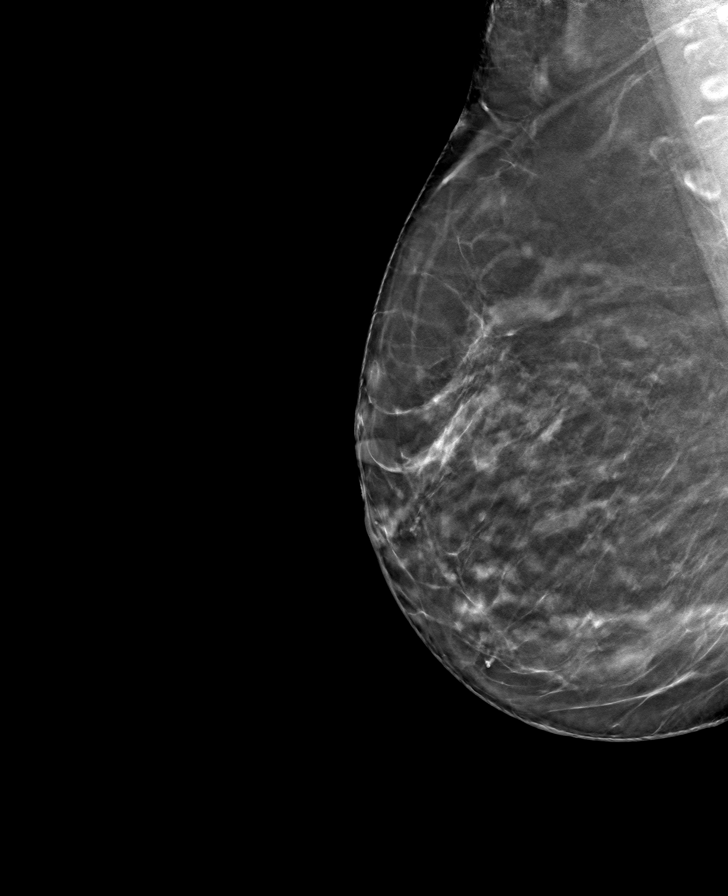

[8 of 24 positions shown; findings below may reference images not displayed]

ACR Breast Density Category b: There are scattered areas of
fibroglandular density.
FINDINGS: There are no findings suspicious for malignancy. The images were
evaluated with computer-aided detection.
IMPRESSION: No mammographic evidence of malignancy. A result letter of this
screening mammogram will be mailed directly to the patient.

RECOMMENDATION:
Screening mammogram in one year. (Code:WJ-I-BG6)

BI-RADS CATEGORY  1: Negative.

## 2022-07-25 ENCOUNTER — Other Ambulatory Visit: Payer: Self-pay | Admitting: Internal Medicine

## 2022-07-25 DIAGNOSIS — Z1231 Encounter for screening mammogram for malignant neoplasm of breast: Secondary | ICD-10-CM

## 2022-08-21 ENCOUNTER — Ambulatory Visit
Admission: RE | Admit: 2022-08-21 | Discharge: 2022-08-21 | Disposition: A | Discharge status: Home / Self Care | Payer: Medicare HMO | Source: Ambulatory Visit | Attending: Internal Medicine | Admitting: Internal Medicine

## 2022-08-21 DIAGNOSIS — Z1231 Encounter for screening mammogram for malignant neoplasm of breast: Secondary | ICD-10-CM

## 2022-11-28 ENCOUNTER — Other Ambulatory Visit (INDEPENDENT_AMBULATORY_CARE_PROVIDER_SITE_OTHER): Payer: Medicare HMO

## 2022-11-28 ENCOUNTER — Ambulatory Visit: Payer: Medicare HMO | Admitting: Physician Assistant

## 2022-11-28 ENCOUNTER — Encounter: Payer: Self-pay | Admitting: Physician Assistant

## 2022-11-28 DIAGNOSIS — M25511 Pain in right shoulder: Secondary | ICD-10-CM | POA: Diagnosis not present

## 2022-11-28 NOTE — Progress Notes (Signed)
HPI: Mrs. Loach comes in today due to right arm pain.  We last saw her in January 2022 at that point time she was 3 months status post a right proximal humerus fracture.  She was doing well.  She states she was doing well until she had a shot of Reclast in her left shoulder started having to use her right shoulder more due to the pain in the left arm.  Left arm pain is resolved.  Pains been ongoing for the past few weeks.  She has been using Aleve for pain with no real relief.  No new injury.  She states she feels like the shoulder is popping.  Pain is anterior aspect the shoulder she also notes that if she turns something or like picks up something heavy with her right arm she has pain up into the right biceps area up into the shoulder.  No real numbness tingling down the arm.  She does have her knots though.  Review of systems: See HPI otherwise negative or noncontributory.  Physical exam: General: Well-developed well-nourished female no acute distress mood affect appropriate. Psych: Alert and oriented x 3 Bilateral shoulders: 5 out of 5 strength external and internal rotation against resistance empty can test is negative.  Impingement testing slightly positive on the right negative on the left.  Forward flexion active 160 degrees on the right passively I can bring her 170 degrees left shoulder 180 degrees forward flexion actively.  Tenderness over the right biceps muscle belly and down into the distal biceps distal biceps is intact.  There is no evidence of biceps rupture proximally or distally.  Some tenderness over the bicipital groove of the right shoulder.  Provocative maneuvers of the forearm against resistance causes pain up into the biceps region and shoulder.  Radiographs: 3 views right shoulder: Shoulder is well located.  No acute fractures.  There is evidence of prior fracture involving the humeral neck and greater tuberosity.  Axillary view shows the glenohumeral joint to be overall  well-maintained.  No bony abnormalities otherwise.  Impression: Right shoulder pain Differential right proximal biceps tendinitis versus posttraumatic arthritic changes  Plan: Will obtain an MRI of her right shoulder to evaluate cartilage in the long head of biceps.  Have her follow-up after the MRI to go over results discuss further treatment.  Questions were encouraged and answered.

## 2022-12-04 ENCOUNTER — Telehealth: Payer: Self-pay | Admitting: Physician Assistant

## 2022-12-04 DIAGNOSIS — M25511 Pain in right shoulder: Secondary | ICD-10-CM

## 2022-12-04 NOTE — Telephone Encounter (Signed)
Mri now in chart

## 2022-12-04 NOTE — Telephone Encounter (Signed)
Pt called about right arm MRI. Pt states [facility] hasn't called. No referral on pt chart. Please send referral. Pt phone number is [phone].

## 2022-12-04 NOTE — Telephone Encounter (Signed)
noted 

## 2022-12-20 ENCOUNTER — Ambulatory Visit
Admission: RE | Admit: 2022-12-20 | Discharge: 2022-12-20 | Disposition: A | Discharge status: Home / Self Care | Payer: Medicare HMO | Source: Ambulatory Visit | Attending: Physician Assistant | Admitting: Physician Assistant

## 2022-12-20 DIAGNOSIS — M25511 Pain in right shoulder: Secondary | ICD-10-CM

## 2023-01-09 ENCOUNTER — Ambulatory Visit: Payer: Medicare HMO | Admitting: Physician Assistant

## 2023-01-09 ENCOUNTER — Encounter: Payer: Self-pay | Admitting: Physician Assistant

## 2023-01-09 DIAGNOSIS — M25511 Pain in right shoulder: Secondary | ICD-10-CM | POA: Diagnosis not present

## 2023-01-09 NOTE — Progress Notes (Signed)
HPI: Mrs. Rabelo returns today for follow-up of her right shoulder.  She underwent an MRI on 12/28/2022.  States she continues to have 4-5 out of 10 pain in the shoulder but the pain is overall improved.  Given she is someone that a right proximal humerus fracture back in October 2021 and was treated conservatively.  Initially did well until just recently in regards to the right shoulder. Right shoulder images and were reviewed with the patient.  MRI of the right shoulder shows mild tendinosis of the supraspinatus and infraspinatus tendons.  Biceps long head intra-articular and extra-articular portions well-maintained.  Glenohumeral joint with partial thickness cartilage loss involving the glenohumeral joint.  Physical exam: General Well-developed well-nourished female no acute distress. Bilateral shoulders 5 5 strength with external and internal rotation against resistance.  Impingement testing causes pain on the right.  Empty can test negative bilaterally.  Active forward flexion right shoulder she comes to approximately 120 degrees today.  Passively I can bring her to under 90 degrees.  Liftoff test is negative bilaterally.  Impression: Right shoulder arthritis  Plan: Will send her for an intra-articular injection right shoulder under ultrasound with Dr. Shon Baton.  After the injection she will make an appointment to follow-up with [name] in 3 to 4 weeks to establish care with him for her shoulder.  Questions were encouraged and answered at length.

## 2023-01-13 ENCOUNTER — Telehealth: Payer: Self-pay | Admitting: Orthopaedic Surgery

## 2023-01-13 NOTE — Telephone Encounter (Signed)
Patient called. Would like to ask some questions about the injection she will have next week. Her cb#[phone]

## 2023-01-20 ENCOUNTER — Ambulatory Visit: Payer: Medicare HMO | Admitting: Sports Medicine

## 2023-01-20 ENCOUNTER — Other Ambulatory Visit: Payer: Self-pay

## 2023-01-20 DIAGNOSIS — M810 Age-related osteoporosis without current pathological fracture: Secondary | ICD-10-CM | POA: Diagnosis not present

## 2023-01-20 DIAGNOSIS — M19011 Primary osteoarthritis, right shoulder: Secondary | ICD-10-CM | POA: Diagnosis not present

## 2023-01-20 DIAGNOSIS — M25511 Pain in right shoulder: Secondary | ICD-10-CM | POA: Diagnosis not present

## 2023-01-20 DIAGNOSIS — G8929 Other chronic pain: Secondary | ICD-10-CM

## 2023-01-20 DIAGNOSIS — Z8781 Personal history of (healed) traumatic fracture: Secondary | ICD-10-CM

## 2023-01-20 MED ORDER — BUPIVACAINE HCL 0.25 % IJ SOLN
2.0000 mL | INTRAMUSCULAR | Status: AC | PRN
  Administered 2023-01-20: 2 mL via INTRA_ARTICULAR

## 2023-01-20 MED ORDER — METHYLPREDNISOLONE ACETATE 40 MG/ML IJ SUSP
80.0000 mg | INTRAMUSCULAR | Status: AC | PRN
  Administered 2023-01-20: 80 mg via INTRA_ARTICULAR

## 2023-01-20 MED ORDER — LIDOCAINE HCL 1 % IJ SOLN
2.0000 mL | INTRAMUSCULAR | Status: AC | PRN
  Administered 2023-01-20: 2 mL

## 2023-01-20 NOTE — Progress Notes (Signed)
Beverly Perry - 67 y.o. female MRN [id number]  Date of birth: 10-12-1955  Office Visit Note: Visit Date: 01/20/2023 PCP: Galvin Proffer, MD Referred by: Kirtland Bouchard, PA-C  Subjective: Chief Complaint  Patient presents with   Right Shoulder - Pain   HPI: Beverly Perry is a pleasant 67 y.o. female who presents today for acute on chronic right shoulder pain.   Acute on chronic right shoulder pain - has performed physical therapy.  Recently had an MRI of the shoulder.  Pain is bothersome and feels deep within the shoulder joint.  She does not take any medication consistently for her pain.  Has a history of right proximal humerus fracture back in October 2021 which was treated conservatively.  History of osteoporosis, reports she had a DEXA scan of -5.  She was on Prolia injections for few years and then transition to Reclast infusions which she performs once yearly.  Was cautioned on consistent steroid use.  Recently her last DEXA was in the osteopenia category.  Pertinent ROS were reviewed with the patient and found to be negative unless otherwise specified above in HPI.   Assessment & Plan: Visit Diagnoses:  1. Primary osteoarthritis, right shoulder   2. Chronic right shoulder pain   3. Age related osteoporosis, unspecified pathological fracture presence   4. History of humerus fracture    Plan: Had a discussion with [name] today regarding her acute on chronic right shoulder pain which does have osteoarthritic change as well as some rotator cuff tendinosis.  Did review her MRI which did not show any evidence of high-grade tearing however.  I do think she is having some posttraumatic arthritic change with cartilage loss within the joint given her history of proximal humerus fracture back in October 2021.  She did have some questions regarding corticosteroid injection given her osteoporosis which she is currently treating with Reclast infusions.  Through shared decision-making, we did  proceed with ultrasound-guided glenohumeral joint injection, patient tolerated well.  She may use ice, Tylenol or Aleve for any postinjection pain.  Advised against 48 to 72 hours of modified rest and activity and then may resume back on her home exercises for the shoulder joint.  Did caution on use of oral prednisone and limiting the consistent intra-articular corticosteroid injections given her osteoporosis but very infrequent injections should be safe for her bone density.  She will follow-up with [name] in a few weeks to establish care for her shoulder for her.  I am happy to see her as needed.  Follow-up: Return for f/u with [name] in 3-4 weeks R-shoulder (per [name] request).   Meds & Orders: No orders of the defined types were placed in this encounter.   Orders Placed This Encounter  Procedures   Large Joint Inj   US Guided Needle Placement - No Linked Charges     Procedures: Large Joint Inj: R glenohumeral on 01/20/2023 2:36 PM Indications: pain Details: 22 G 3.5 in needle, ultrasound-guided posterior approach Medications: 2 mL lidocaine 1 %; 2 mL bupivacaine 0.25 %; 80 mg methylPREDNISolone acetate 40 MG/ML Outcome: tolerated well, no immediate complications  US-guided glenohumeral joint injection, right shoulder After discussion on risks/benefits/indications, informed verbal consent was obtained. A timeout was then performed. The patient was positioned lying lateral recumbent on examination table. The patient's shoulder was prepped with betadine and multiple alcohol swabs and utilizing ultrasound guidance, the patient's glenohumeral joint was identified on ultrasound. Using ultrasound guidance a 22-gauge, 3.5 inch needle  with a mixture of 2:2:2 cc's lidocaine:bupivicaine:depomedrol was directed from a lateral to medial direction via in-plane technique into the glenohumeral joint with visualization of appropriate spread of injectate into the joint. Patient tolerated the procedure  well without immediate complications.      Procedure, treatment alternatives, risks and benefits explained, specific risks discussed. Consent was given by the patient. Immediately prior to procedure a time out was called to verify the correct patient, procedure, equipment, support staff and site/side marked as required. Patient was prepped and draped in the usual sterile fashion.          Clinical History: No specialty comments available.  She reports that she has never smoked. She has never used smokeless tobacco. No results for input(s): "HGBA1C", "LABURIC" in the last 8760 hours.  Objective:    Physical Exam  Gen: Well-appearing, in no acute distress; non-toxic CV: Well-perfused. Warm.  Resp: Breathing unlabored on room air; no wheezing. Psych: Fluid speech in conversation; appropriate affect; normal thought process Neuro: Sensation intact throughout. No gross coordination deficits.   Ortho Exam - Right shoulder: No AC joint TTP.  Forward flexion to approximately 125 degrees, abduction 105 degrees, I can take her slightly further passively although she does have endrange restriction with abduction and external rotation.  Thumb to L4.  There is some pain with drop arm testing and resisted abduction.  No weakness with resisted external or internal rotation. + Hawkin's impingement.  Imaging:  *Independent review and interpretation of right shoulder MRI from 12/20/2022 was performed by myself today.  There is moderate cartilage loss about the glenohumeral joint with some areas of high-grade reactive marrow edema.  There is some tendinosis of the supraspinatus and infraspinatus more so at the critical zone, although no appreciable high-grade tearing.  There is mild to moderate osteoarthritis throughout the right glenohumeral joint.  Likely prior proximal humerus fracture.  MR Shoulder Right Wo Contrast CLINICAL DATA:  Right shoulder pain radiating into the arm to  the elbow.  EXAM: MRI OF THE RIGHT SHOULDER WITHOUT CONTRAST  TECHNIQUE: Multiplanar, multisequence MR imaging of the shoulder was performed. No intravenous contrast was administered.  COMPARISON:  None Available.  FINDINGS: Rotator cuff: Mild tendinosis of the supraspinatus and infraspinatus tendons. Teres minor tendon is intact. Subscapularis tendon is intact.  Muscles: No muscle atrophy or edema. No intramuscular fluid collection or hematoma.  Biceps Long Head: Intraarticular and extraarticular portions of the biceps tendon are intact.  Acromioclavicular Joint: Mild arthropathy of the acromioclavicular joint. No subacromial/subdeltoid bursal fluid.  Glenohumeral Joint: No joint effusion. Partial-thickness cartilage loss of the glenohumeral joint with mild subchondral reactive marrow edema along the posterior glenoid.  Labrum: Grossly intact, but evaluation is limited by lack of intraarticular fluid/contrast.  Bones: No fracture or dislocation. No aggressive osseous lesion.  Other: No fluid collection or hematoma.  IMPRESSION: 1. Mild tendinosis of the supraspinatus and infraspinatus tendons. 2. Mild-moderate osteoarthritis of the glenohumeral joint.  Electronically Signed   By: Elige Ko M.D.   On: 12/28/2022 07:23  Past Medical/Family/Surgical/Social History: Medications & Allergies reviewed per EMR, new medications updated. There are no problems to display for this patient.  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 pylori test    Raynaud's syndrome    Renal disorder    Stroke [facility]    ? CVA  3 yrs ago   UTI (urinary tract infection)    Family History  Problem Relation Age of Onset   Prostate cancer Father    Crohn's disease Father    Breast cancer Sister    Colon polyps Sister    Cirrhosis Sister        non alcoholic cirrhosis   Colon cancer Neg Hx        PATIENT DOESN'T KNOW MOM'S SIDE OF MEDICAL HISTORY    Esophageal cancer Neg Hx    Rectal cancer Neg Hx    Stomach cancer Neg Hx    Past Surgical History:  Procedure Laterality Date   ABDOMINAL HYSTERECTOMY     BREAST EXCISIONAL BIOPSY Right 2017   BREAST LUMPECTOMY WITH RADIOACTIVE SEED LOCALIZATION Right 03/22/2016   Procedure: RIGHT BREAST LUMPECTOMY WITH RADIOACTIVE SEED LOCALIZATION;  Surgeon: Chevis Pretty III, MD;  Location: [facility];  Service: General;  Laterality: Right;   CHOLECYSTECTOMY     COLONOSCOPY  11/30/2014   Colonic polyp status post polypectomy. Mild colonic diverticulosis.    CYSTOSCOPY WITH RETROGRADE PYELOGRAM, URETEROSCOPY AND STENT PLACEMENT Left 03/07/2020   Procedure: CYSTOSCOPY WITH LEFT RETROGRADE PYELOGRAM, URETEROSCOPY, STONE EXTRACTION,LASER  AND STENT PLACEMENT;  Surgeon: Crist Fat, MD;  Location: [facility];  Service: Urology;  Laterality: Left;   PARATHYROIDECTOMY     Social History   Occupational History   Not on file  Tobacco Use   Smoking status: Never   Smokeless tobacco: Never  Vaping Use   Vaping status: Never Used  Substance and Sexual Activity   Alcohol use: Yes    Comment: occ   Drug use: No   Sexual activity: Not on file

## 2023-02-10 ENCOUNTER — Encounter: Payer: Self-pay | Admitting: Orthopedic Surgery

## 2023-02-10 ENCOUNTER — Ambulatory Visit: Payer: Medicare HMO | Admitting: Orthopedic Surgery

## 2023-02-10 DIAGNOSIS — M19011 Primary osteoarthritis, right shoulder: Secondary | ICD-10-CM | POA: Diagnosis not present

## 2023-02-12 ENCOUNTER — Encounter: Payer: Self-pay | Admitting: Orthopedic Surgery

## 2023-02-12 NOTE — Progress Notes (Signed)
Office Visit Note   Patient: Beverly Perry           Date of Birth: 04/20/1955           MRN: [id number] Visit Date: 02/10/2023 Requested by: Galvin Proffer, MD [address] [place],  [place] [address] PCP: Galvin Proffer, MD  Subjective: Chief Complaint  Patient presents with   Right Shoulder - Pain    HPI: Beverly Perry is a 67 y.o. female who presents to the office reporting right shoulder pain.  Patient had prior glenohumeral joint injection performed 1014 which gave relief for 2 weeks.  Patient is right-hand dominant.  Does not wake her from sleep at night.  She denies much in the way of decreased range of motion.  States the pain comes and goes.  Has some occasional radicular pain as well as numbness and tingling in the hand.  Denies any neck pain or scapular pain.  She does get a little relief with her arm overhead when she is sleeping.  She states that she is learning what she can and cannot do.  Cold weather and cold in general does increase the pain in the shoulder.  She describes having osteoporosis as well as Raynaud's disease.  She did have very good relief from the glenohumeral joint injection performed in mid October.  Patient does have an MRI scan from September which shows mild to moderate arthritis of the glenohumeral joint and no full-thickness rotator cuff tears..                ROS: All systems reviewed are negative as they relate to the chief complaint within the history of present illness.  Patient denies fevers or chills.  Assessment & Plan: Visit Diagnoses: No diagnosis found.  Plan: Impression is right shoulder arthritis with good relief from glenohumeral joint injection.  I think [name] may be heading for intervention at sometime in the future but currently she is doing reasonably well with motion and strength.  At this time recommend that [name] get episodic injections a couple times a year if her shoulder flares up.  Otherwise she is in a reasonably good  position with her strength and range of motion.  May need intervention in the future but for now we will see how she does.  Follow-up with [place] as needed.  Follow-Up Instructions: No follow-ups on file.   Orders:  No orders of the defined types were placed in this encounter.  No orders of the defined types were placed in this encounter.     Procedures: No procedures performed   Clinical Data: No additional findings.  Objective: Vital Signs: There were no vitals taken for this visit.  Physical Exam:  Constitutional: Patient appears well-developed HEENT:  Head: Normocephalic Eyes:EOM are normal Neck: Normal range of motion Cardiovascular: Normal rate Pulmonary/chest: Effort normal Neurologic: Patient is alert Skin: Skin is warm Psychiatric: Patient has normal mood and affect  Ortho Exam: Ortho exam demonstrates bilateral shoulder passive range of motion of 60/90/160.  Rotator cuff strength intact on the right infraspinatus supraspinatus and subscap muscle testing.  Minimal crepitus with passive range of motion in both shoulders.  No discrete AC joint tenderness.  Neck range of motion is full.  Motor or sensory function of the right arm intact.  Specialty Comments:  No specialty comments available.  Imaging: No results found.   PMFS History: There are no problems to display for this patient.  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 pylori test    Raynaud's syndrome    Renal disorder    Stroke [facility])    ? CVA 3 yrs ago   UTI (urinary tract infection)     Family History  Problem Relation Age of Onset    Prostate cancer Father    Crohn's disease Father    Breast cancer Sister    Colon polyps Sister    Cirrhosis Sister        non alcoholic cirrhosis   Colon cancer Neg Hx        PATIENT DOESN'T KNOW MOM'S SIDE OF MEDICAL HISTORY    Esophageal cancer Neg Hx    Rectal cancer Neg Hx    Stomach cancer Neg Hx     Past Surgical History:  Procedure Laterality Date   ABDOMINAL HYSTERECTOMY     BREAST EXCISIONAL BIOPSY Right 2017   BREAST LUMPECTOMY WITH RADIOACTIVE SEED LOCALIZATION Right 03/22/2016   Procedure: RIGHT BREAST LUMPECTOMY WITH RADIOACTIVE SEED LOCALIZATION;  Surgeon: Chevis Pretty III, MD;  Location: [facility];  Service: General;  Laterality: Right;   CHOLECYSTECTOMY     COLONOSCOPY  11/30/2014   Colonic polyp status post polypectomy. Mild colonic diverticulosis.    CYSTOSCOPY WITH RETROGRADE PYELOGRAM, URETEROSCOPY AND STENT PLACEMENT Left 03/07/2020   Procedure: CYSTOSCOPY WITH LEFT RETROGRADE PYELOGRAM, URETEROSCOPY, STONE EXTRACTION,LASER  AND STENT PLACEMENT;  Surgeon: Crist Fat, MD;  Location: [facility];  Service: Urology;  Laterality: Left;   PARATHYROIDECTOMY     Social History   Occupational History   Not on file  Tobacco Use   Smoking status: Never   Smokeless tobacco: Never  Vaping Use   Vaping status: Never Used  Substance and Sexual Activity   Alcohol use: Yes    Comment: occ   Drug use: No   Sexual activity: Not on file

## 2023-05-21 ENCOUNTER — Other Ambulatory Visit: Payer: Self-pay | Admitting: Internal Medicine

## 2023-05-21 DIAGNOSIS — M545 Low back pain, unspecified: Secondary | ICD-10-CM

## 2023-06-08 ENCOUNTER — Inpatient Hospital Stay: Admission: RE | Admit: 2023-06-08 | Payer: No Typology Code available for payment source | Source: Ambulatory Visit

## 2023-06-21 ENCOUNTER — Ambulatory Visit
Admission: RE | Admit: 2023-06-21 | Discharge: 2023-06-21 | Disposition: A | Discharge status: Home / Self Care | Source: Ambulatory Visit | Attending: Internal Medicine | Admitting: Internal Medicine

## 2023-06-21 DIAGNOSIS — M545 Low back pain, unspecified: Secondary | ICD-10-CM

## 2023-07-16 ENCOUNTER — Other Ambulatory Visit: Payer: Self-pay | Admitting: Internal Medicine

## 2023-09-15 ENCOUNTER — Encounter: Payer: Self-pay | Admitting: Gastroenterology

## 2023-10-14 ENCOUNTER — Ambulatory Visit (AMBULATORY_SURGERY_CENTER)

## 2023-10-14 VITALS — Ht 62.0 in | Wt 134.0 lb

## 2023-10-14 DIAGNOSIS — Z860100 Personal history of colon polyps, unspecified: Secondary | ICD-10-CM

## 2023-10-14 MED ORDER — NA SULFATE-K SULFATE-MG SULF 17.5-3.13-1.6 GM/177ML PO SOLN: 1.0000 | Freq: Once | ORAL | 0 refills | Status: AC

## 2023-10-14 NOTE — Progress Notes (Signed)
 No egg or soy allergy known to patient  No issues known to pt with past sedation with any surgeries or procedures Patient denies ever being told they had issues or difficulty with intubation  No FH of Malignant Hyperthermia Pt is not on diet pills nor GLP-1 medications Pt is not on  home 02  Pt is not on blood thinners  Pt denies issues with chronic constipation  No A fib or A flutter Have any cardiac testing pending--no Pt instructed to use Singlecare.[email or web address] or GoodRx for a price reduction on prep  Ambulates independently

## 2023-10-21 ENCOUNTER — Ambulatory Visit: Admitting: Gastroenterology

## 2023-10-21 ENCOUNTER — Encounter: Payer: Self-pay | Admitting: Gastroenterology

## 2023-10-21 VITALS — BP 131/60 | HR 65 | Temp 98.1°F | Resp 14 | Ht 62.0 in | Wt 134.0 lb

## 2023-10-21 DIAGNOSIS — Z1211 Encounter for screening for malignant neoplasm of colon: Secondary | ICD-10-CM | POA: Diagnosis present

## 2023-10-21 DIAGNOSIS — K635 Polyp of colon: Secondary | ICD-10-CM | POA: Diagnosis not present

## 2023-10-21 DIAGNOSIS — K5730 Diverticulosis of large intestine without perforation or abscess without bleeding: Secondary | ICD-10-CM | POA: Diagnosis not present

## 2023-10-21 DIAGNOSIS — K640 First degree hemorrhoids: Secondary | ICD-10-CM

## 2023-10-21 DIAGNOSIS — Z860100 Personal history of colon polyps, unspecified: Secondary | ICD-10-CM

## 2023-10-21 DIAGNOSIS — D123 Benign neoplasm of transverse colon: Secondary | ICD-10-CM

## 2023-10-21 MED ORDER — SODIUM CHLORIDE 0.9 % IV SOLN: 500.0000 mL | Freq: Once | INTRAVENOUS | Status: DC

## 2023-10-21 NOTE — Progress Notes (Signed)
 A/O x 3, gd SR's, VSS, report to RN

## 2023-10-21 NOTE — Progress Notes (Signed)
 Called to room to assist during endoscopic procedure.  Patient ID and intended procedure confirmed with present staff. Received instructions for my participation in the procedure from the performing physician.

## 2023-10-21 NOTE — Patient Instructions (Signed)
 YOU HAD AN ENDOSCOPIC PROCEDURE TODAY AT THE [facility]:   Refer to the procedure report that was given to you for any specific questions about what was found during the examination.  If the procedure report does not answer your questions, please call your gastroenterologist to clarify.  If you requested that your care partner not be given the details of your procedure findings, then the procedure report has been included in a sealed envelope for you to review at your convenience later.  YOU SHOULD EXPECT: Some feelings of bloating in the abdomen. Passage of more gas than usual.  Walking can help get rid of the air that was put into your GI tract during the procedure and reduce the bloating. If you had a lower endoscopy (such as a colonoscopy or flexible sigmoidoscopy) you may notice spotting of blood in your stool or on the toilet paper. If you underwent a bowel prep for your procedure, you may not have a normal bowel movement for a few days.  Please Note:  You might notice some irritation and congestion in your nose or some drainage.  This is from the oxygen used during your procedure.  There is no need for concern and it should clear up in a day or so.  SYMPTOMS TO REPORT IMMEDIATELY:  Following lower endoscopy (colonoscopy or flexible sigmoidoscopy):  Excessive amounts of blood in the stool  Significant tenderness or worsening of abdominal pains  Swelling of the abdomen that is new, acute  Fever of 100F or higher  For urgent or emergent issues, a gastroenterologist can be reached at any hour by calling [phone]. Do not use MyChart messaging for urgent concerns.    DIET:  We do recommend a small meal at first, but then you may proceed to your regular diet.  Drink plenty of fluids but you should avoid alcoholic beverages for 24 hours.  Resume previous diet.  MEDICATIONS: Continue present medications.  Handouts provided on polyps, diverticulosis, and  hemorrhoids  Awaiting pathology results  Repeat colonoscopy date to be determined after pending pathology results are reviewed for surveillance. May not be needed due to age.   ACTIVITY:  You should plan to take it easy for the rest of today and you should NOT DRIVE or use heavy machinery until tomorrow (because of the sedation medicines used during the test).    FOLLOW UP: Our staff will call the number listed on your records the next business day following your procedure.  We will call around 7:15- 8:00 am to check on you and address any questions or concerns that you may have regarding the information given to you following your procedure. If we do not reach you, we will leave a message.     If any biopsies were taken you will be contacted by phone or by letter within the next 1-3 weeks.  Please call us  at [phone] if you have not heard about the biopsies in 3 weeks.    SIGNATURES/CONFIDENTIALITY: You and/or your care partner have signed paperwork which will be entered into your electronic medical record.  These signatures attest to the fact that that the information above on your After Visit Summary has been reviewed and is understood.  Full responsibility of the confidentiality of this discharge information lies with you and/or your care-partner.

## 2023-10-21 NOTE — Progress Notes (Signed)
 Chief Complaint: diarrhea  Referring Provider:  Pia Kerney SQUIBB, MD      ASSESSMENT AND PLAN;   #1.  Diarrhea- neg stool studies, neg NCCT  #2.  Right flank pain- neg NCCT 04/07/2018-except for small L renal stone, small hiatal hernia, fatty liver (with Nl LFTs)  #3.  H/O Colonic polyps  Plan: - TSH, CRP and sed rate (She will get it done from Dr [facility] office, has appt next week). - Lomotil 1 tid prn to continue. - Bentyl  10mg  po bid (1/2hr before meal and QHS) - Stop fish oil x 2 weeks. - Proceed with colonoscopy.  I have discussed the risks and benefits.  The risks including risk of perforation requiring laparotomy, bleeding after polypectomy requiring blood transfusions and risks of anesthesia/sedation were discussed.  Rare risks of missing colorectal neoplasms were also discussed.  Consent forms were given for review.    HPI:    Beverly Perry is a 68 y.o. female  Right flank pain 04/07/2018, diagnosed with Klebsiella UTI, treated with Macrobid 100 mg p.o. twice daily for 7 days Nl CBC, CMP With diarrhea x since 03/2018, 8-9/day, occ nocturnal symptoms Neg stool for c. Diff No melena or hematochezia No recent weight loss.  In fact she has gained 10 pounds over the last 5 months. Does admit that she has been under considerable stress No nausea, vomiting, heartburn, odynophagia or dysphagia. No fever or chills.  No sodas, chocolates, chewing gums and candy. NO history of travel, artificial sweeteners or history suggestive of lactose intolerance.  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 65 year old great granddaughter-living with her (social)  Past GI procedures: -Colonoscopy 11/2014 ([facility]) -small tubular adenoma status post polypectomy, mild sigmoid diverticulosis.  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 pylori test    Raynaud's syndrome    Renal disorder    Stroke [facility])    ? CVA 3 yrs ago   UTI (urinary tract infection)     Past Surgical History:  Procedure Laterality Date   ABDOMINAL HYSTERECTOMY     BREAST EXCISIONAL BIOPSY Right 2017   BREAST LUMPECTOMY WITH RADIOACTIVE SEED LOCALIZATION Right 03/22/2016   Procedure: RIGHT BREAST LUMPECTOMY WITH RADIOACTIVE SEED LOCALIZATION;  Surgeon: Deward Null III, MD;  Location: [facility];  Service: General;  Laterality: Right;   CHOLECYSTECTOMY     COLONOSCOPY  11/30/2014   Colonic polyp status post polypectomy. Mild colonic diverticulosis.    CYSTOSCOPY WITH RETROGRADE PYELOGRAM, URETEROSCOPY AND STENT PLACEMENT Left 03/07/2020   Procedure: CYSTOSCOPY WITH LEFT RETROGRADE PYELOGRAM, URETEROSCOPY, STONE EXTRACTION,LASER  AND STENT PLACEMENT;  Surgeon: Cam Morene ORN, MD;  Location: [facility];  Service: Urology;  Laterality: Left;   PARATHYROIDECTOMY      Family History  Problem Relation Age of Onset   Prostate cancer Father    Crohn's disease Father    Breast cancer Sister    Colon polyps Sister  Cirrhosis Sister        non alcoholic cirrhosis   Colon cancer Neg Hx        PATIENT DOESN'T KNOW MOM'S SIDE OF MEDICAL HISTORY    Esophageal cancer Neg Hx    Rectal cancer Neg Hx    Stomach cancer Neg Hx     Social History   Tobacco Use   Smoking status: Never   Smokeless tobacco: Never  Vaping Use   Vaping status: Never Used  Substance Use Topics   Alcohol use: Not Currently   Drug use: No    Current Outpatient Medications  Medication Sig Dispense Refill   albuterol (PROAIR HFA) 108 (90 Base) MCG/ACT inhaler Inhale 1-2 puffs  into the lungs every 6 (six) hours as needed.     aspirin 325 MG tablet Take 325 mg by mouth daily.     buPROPion (WELLBUTRIN XL) 150 MG 24 hr tablet Take 450 mg by mouth daily.     cholecalciferol (VITAMIN D) 1000 units tablet Take 1,000 Units by mouth daily.     clonazePAM (KLONOPIN) 0.5 MG tablet Take 0.5 mg by mouth 2 (two) times daily as needed for anxiety.     fenofibrate micronized (LOFIBRA) 200 MG capsule Take 200 mg by mouth daily.     gemfibrozil (LOPID) 600 MG tablet Take 600 mg by mouth 2 (two) times daily before a meal.     levothyroxine (SYNTHROID, LEVOTHROID) 150 MCG tablet Take 150 mcg by mouth daily.     Omega-3 Fatty Acids (FISH OIL) 1000 MG CAPS Take 1,000 mg by mouth daily.      potassium chloride (KLOR-CON M) 10 MEQ tablet Take 50 mEq by mouth daily.     venlafaxine XR (EFFEXOR-XR) 150 MG 24 hr capsule Take 150 mg by mouth daily.     calcium carbonate (OS-CAL - DOSED IN MG OF ELEMENTAL CALCIUM) 1250 (500 Ca) MG tablet Take 1 tablet by mouth daily.  (Patient not taking: Reported on 10/14/2023)     Calcium Carbonate Antacid (TUMS PO) Take 1 tablet by mouth daily. (Patient not taking: Reported on 10/21/2023)     fluticasone furoate-vilanterol (BREO ELLIPTA ) 100-25 MCG/INH AEPB Inhale 1 puff into the lungs daily. 1 each 3   gabapentin (NEURONTIN) 100 MG capsule Take 100 mg by mouth at bedtime. (Patient not taking: Reported on 10/21/2023)     phenazopyridine  (PYRIDIUM ) 200 MG tablet Take 1 tablet (200 mg total) by mouth 3 (three) times daily as needed for pain. (Patient not taking: Reported on 10/14/2023) 10 tablet 0   VENTOLIN HFA 108 (90 Base) MCG/ACT inhaler Inhale 2 puffs into the lungs every 6 (six) hours as needed for wheezing or shortness of breath.      VIT B12-METHIONINE-INOS-CHOL IM Inject 1 each into the muscle every 30 (thirty) days.      zoledronic acid (RECLAST) 5 MG/100ML SOLN injection Inject into the vein once. Every 2 years     Current Facility-Administered Medications   Medication Dose Route Frequency Provider Last Rate Last Admin   0.9 %  sodium chloride  infusion  500 mL Intravenous Once Charlanne Groom, MD        Allergies  Allergen Reactions   Hydrocodone  Nausea And Vomiting and Nausea Only    Review of Systems:  Constitutional: Denies fever, chills, diaphoresis, appetite change and fatigue.  HEENT: Denies photophobia, eye pain, redness, hearing loss, ear pain, congestion, sore throat, rhinorrhea, sneezing, mouth sores, neck pain, neck stiffness and tinnitus.   Respiratory: Denies  SOB, DOE, cough, chest tightness,  and wheezing.   Cardiovascular: Denies chest pain, palpitations and leg swelling.  Genitourinary: Denies dysuria, urgency, frequency, hematuria, flank pain and difficulty urinating.  Musculoskeletal: Denies myalgias, back pain, joint swelling, arthralgias and gait problem.  Skin: No rash.  Neurological: Denies dizziness, seizures, syncope, weakness, light-headedness, numbness and headaches.  Hematological: Denies adenopathy. Easy bruising, personal or family bleeding history  Psychiatric/Behavioral: No anxiety or depression     Physical Exam:    BP (!) 144/68   Pulse 77   Temp 98.1 F (36.7 C) (Temporal)   Ht 5' 2 (1.575 m)   Wt 134 lb (60.8 kg)   SpO2 96%   BMI 24.51 kg/m  Filed Weights   10/21/23 1300  Weight: 134 lb (60.8 kg)   Constitutional:  Well-developed, in no acute distress. Psychiatric: Normal mood and affect. Behavior is normal. HEENT: Pupils normal.  Conjunctivae are normal. No scleral icterus. Neck supple.  Cardiovascular: Normal rate, regular rhythm. No edema Pulmonary/chest: Effort normal and breath sounds normal. No wheezing, rales or rhonchi. Abdominal: Soft, nondistended. Nontender. Bowel sounds active throughout. There are no masses palpable. No hepatomegaly. Rectal:  defered Neurological: Alert and oriented to person place and time. Skin: Skin is warm and dry. No rashes noted. Extensive notes  including CT scan report, ED notes were reviewed.  Copy of the CT report was given to the patient.  Anselm Bring, MD 10/21/2023, 2:06 PM  Cc: Pia Kerney SQUIBB, MD

## 2023-10-21 NOTE — Op Note (Signed)
 [facility] Patient Name: Beverly Perry Procedure Date: 10/21/2023 2:07 PM MRN: [id number] Endoscopist: Lynnie Bring , MD, [id number] Age: 68 Referring MD:  Date of Birth: [birth year] Gender: Female Account #: [id number] Procedure:                Colonoscopy Indications:              High risk colon cancer surveillance: Personal                            history of colonic polyps Medicines:                Monitored Anesthesia Care Procedure:                Pre-Anesthesia Assessment:                           - Prior to the procedure, a History and Physical                            was performed, and patient medications and                            allergies were reviewed. The patient's tolerance of                            previous anesthesia was also reviewed. The risks                            and benefits of the procedure and the sedation                            options and risks were discussed with the patient.                            All questions were answered, and informed consent                            was obtained. Prior Anticoagulants: The patient has                            taken no anticoagulant or antiplatelet agents. ASA                            Grade Assessment: II - A patient with mild systemic                            disease. After reviewing the risks and benefits,                            the patient was deemed in satisfactory condition to                            undergo the procedure.  After obtaining informed consent, the colonoscope                            was passed under direct vision. Throughout the                            procedure, the patient's blood pressure, pulse, and                            oxygen saturations were monitored continuously. The                            PCF-HQ190L Colonoscope [id number] was introduced                            through the anus and advanced to the 2 cm  into the                            ileum. The colonoscopy was performed without                            difficulty. The patient tolerated the procedure                            well. The quality of the bowel preparation was                            good. The terminal ileum, ileocecal valve,                            appendiceal orifice, and rectum were photographed. Scope In: 2:17:34 PM Scope Out: 2:30:49 PM Scope Withdrawal Time: 0 hours 9 minutes 6 seconds  Total Procedure Duration: 0 hours 13 minutes 15 seconds  Findings:                 A 6 mm polyp was found in the mid transverse colon.                            The polyp was sessile. The polyp was removed with a                            cold snare. Resection and retrieval were complete.                           Multiple medium-mouthed diverticula were found in                            the sigmoid colon and descending colon. Rare                            diverticula in the ascending colon.                           Non-bleeding internal hemorrhoids were  found during                            retroflexion. The hemorrhoids were small and Grade                            I (internal hemorrhoids that do not prolapse).                           The terminal ileum appeared normal.                           The exam was otherwise without abnormality on                            direct and retroflexion views. Complications:            No immediate complications. Estimated Blood Loss:     Estimated blood loss: none. Impression:               - One 6 mm polyp in the mid transverse colon,                            removed with a cold snare. Resected and retrieved.                           - Moderate left colonic diverticulosis.                           - Non-bleeding internal hemorrhoids.                           - The examined portion of the ileum was normal.                           - The examination was otherwise  normal on direct                            and retroflexion views. Recommendation:           - Patient has a contact number available for                            emergencies. The signs and symptoms of potential                            delayed complications were discussed with the                            patient. Return to normal activities tomorrow.                            Written discharge instructions were provided to the                            patient.                           -  Resume previous diet.                           - Continue present medications.                           - Await pathology results.                           - Repeat colonoscopy date to be determined after                            pending pathology results are reviewed for                            surveillance. May not be needed d/t age.                           - The findings and recommendations were discussed                            with the patient's family. Lynnie Bring, MD 10/21/2023 2:35:52 PM This report has been signed electronically.

## 2023-10-22 ENCOUNTER — Telehealth: Payer: Self-pay | Admitting: *Deleted

## 2023-10-22 NOTE — Telephone Encounter (Signed)
  Follow up Call-     10/21/2023    1:00 PM  Call back number  Post procedure Call Back phone  # [phone]  Permission to leave phone message Yes     Patient questions:   Message left to call us  if necessary.

## 2023-10-24 LAB — SURGICAL PATHOLOGY

## 2023-10-26 ENCOUNTER — Ambulatory Visit: Payer: Self-pay | Admitting: Gastroenterology

## 2024-02-09 ENCOUNTER — Encounter: Payer: Self-pay | Admitting: Radiology

## 2024-02-16 ENCOUNTER — Other Ambulatory Visit: Payer: Self-pay | Admitting: Internal Medicine

## 2024-02-16 DIAGNOSIS — Z1231 Encounter for screening mammogram for malignant neoplasm of breast: Secondary | ICD-10-CM

## 2024-03-11 ENCOUNTER — Ambulatory Visit
Admission: RE | Admit: 2024-03-11 | Discharge: 2024-03-11 | Disposition: A | Source: Ambulatory Visit | Attending: Internal Medicine | Admitting: Internal Medicine

## 2024-03-11 DIAGNOSIS — Z1231 Encounter for screening mammogram for malignant neoplasm of breast: Secondary | ICD-10-CM

## 2024-04-23 ENCOUNTER — Ambulatory Visit: Admitting: Gastroenterology

## 2024-04-23 ENCOUNTER — Other Ambulatory Visit: Payer: Self-pay

## 2024-04-23 VITALS — BP 124/72 | HR 76 | Ht 60.0 in | Wt 132.0 lb

## 2024-04-23 DIAGNOSIS — D509 Iron deficiency anemia, unspecified: Secondary | ICD-10-CM | POA: Diagnosis not present

## 2024-04-23 DIAGNOSIS — R112 Nausea with vomiting, unspecified: Secondary | ICD-10-CM | POA: Diagnosis not present

## 2024-04-23 DIAGNOSIS — K921 Melena: Secondary | ICD-10-CM | POA: Diagnosis not present

## 2024-04-23 NOTE — Patient Instructions (Addendum)
 Possible GI bleed/ iron deficiency anemia Continue omeprazole 40 mg po daily  Continue ferrous sulfate 1 tablet po daily  Recommend GERD diet, no late meals No NSAIDs (motrin, ibuprofen)  You have been scheduled for an endoscopy. Please follow written instructions given to you at your visit today.  If you use inhalers (even only as needed), please bring them with you on the day of your procedure.  If you take any of the following medications, they will need to be adjusted prior to your procedure:   DO NOT TAKE 7 DAYS PRIOR TO TEST- Trulicity (dulaglutide) Ozempic, Wegovy (semaglutide) Mounjaro, Zepbound (tirzepatide) Bydureon Bcise (exanatide extended release)  DO NOT TAKE 1 DAY PRIOR TO YOUR TEST Rybelsus (semaglutide) Adlyxin (lixisenatide) Victoza (liraglutide) Byetta (exanatide) ___________________________________________________________________________  Due to recent changes in healthcare laws, you may see the results of your imaging and laboratory studies on MyChart before your provider has had a chance to review them.  We understand that in some cases there may be results that are confusing or concerning to you. Not all laboratory results come back in the same time frame and the provider may be waiting for multiple results in order to interpret others.  Please give us  48 hours in order for your provider to thoroughly review all the results before contacting the office for clarification of your results.   _______________________________________________________  If your blood pressure at your visit was 140/90 or greater, please contact your primary care physician to follow up on this.  _______________________________________________________  If you are age 31 or older, your body mass index should be between 23-30. Your Body mass index is 25.78 kg/m. If this is out of the aforementioned range listed, please consider follow up with your Primary Care Provider.  If you are age 32  or younger, your body mass index should be between 19-25. Your Body mass index is 25.78 kg/m. If this is out of the aformentioned range listed, please consider follow up with your Primary Care Provider.   ________________________________________________________  The [facility] providers would like to encourage you to use MYCHART to communicate with providers for non-urgent requests or questions.  Due to long hold times on the telephone, sending your provider a message by [facility] may be a faster and more efficient way to get a response.  Please allow 48 business hours for a response.  Please remember that this is for non-urgent requests.  _______________________________________________________  [facility] is using a team-based approach to care.  Your team is made up of your doctor and two to three APPS. Our APPS (Nurse Practitioners and Physician Assistants) work with your physician to ensure care continuity for you. They are fully qualified to address your health concerns and develop a treatment plan. They communicate directly with your gastroenterologist to care for you. Seeing the Advanced Practice Practitioners on your physician's team can help you by facilitating care more promptly, often allowing for earlier appointments, access to diagnostic testing, procedures, and other specialty referrals.   Thank you for trusting me with your gastrointestinal care. Deanna May, FNP-C

## 2024-04-23 NOTE — Progress Notes (Signed)
 "  Chief Complaint: GERD, iron deficiency  Primary GI Doctor: Dr. Charlanne  HPI:  Beverly Perry is a  69  year old female Beverly Perry with past medical history of anxiety, Graves disease, and Raynaud's syndrome, who was referred to me by Pia Kerney SQUIBB, MD on 03/22/24 for a evaluation of GERD.    Interval History Beverly Perry presents for evaluation of iron deficiency anemia.  Beverly Perry taking oral iron supplement 1 tablet po daily since December.  Beverly Perry noted black tarry stools in December for a few weeks.  She reports occasionally now seeing dark stools, unsure if due to iron. Not a vegetarian. Does not donate blood.  Beverly Perry reports she is very active person and recently has been very fatigued and overall doesn't feel well. She has been taking more naps. She has lightheadedness. She has SOB with exertion.  Beverly Perry has had nausea, very little vomiting. Beverly Perry reports intermittent RUQ cramping.  Denies GERD or dysphagia. Appetite as decreased. Lost about 3-4lbs.   Nonsmoker. No alcohol use.  Beverly Perry taking Baby ASA 325 mg po daily. No hx of stroke.  Does not take any other NSAID's. She reports she was taking something prescribed by surgeon for shoulder injury for about 8 mths but was discontinued in December due for concerns of bleeding.   She is taking Omeprazole 40 mg po daily prescribed by PCP in December.   Never had EGD.  Beverly Perry's family history includes: mothers side unknown, reports she passed away early and told by Aunt she had blood disorder?  GI procedures: 10/21/23 colonoscopy: One 6 mm polyp in the mid transverse colon, removed with a cold snare. Resected and retrieved. Moderate left colonic diverticulosis. Non- bleeding internal hemorrhoids. The examined portion of the ileum was normal. The examination was otherwise normal on direct and retroflexion views. Path: 1. Surgical [P], colon, transverse, polyp (1) :       - HYPERPLASTIC POLYP.       - NO DYSPLASIA OR MALIGNANCY.   06/2018  colonoscopy: Mild pancolonic diverticulosis. Non- bleeding internal hemorrhoids. Otherwise normal colonoscopy to TI.  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 pylori test    Raynaud's syndrome    Renal disorder    Stroke [facility])    ? CVA 3 yrs ago   UTI (urinary tract infection)     Past Surgical History:  Procedure Laterality Date   ABDOMINAL HYSTERECTOMY     BREAST EXCISIONAL BIOPSY Right 2017   BREAST LUMPECTOMY WITH RADIOACTIVE SEED LOCALIZATION Right 03/22/2016   Procedure: RIGHT BREAST LUMPECTOMY WITH RADIOACTIVE SEED LOCALIZATION;  Surgeon: Deward Null III, MD;  Location: [facility];  Service: General;  Laterality: Right;   CHOLECYSTECTOMY     COLONOSCOPY  11/30/2014   Colonic polyp status post polypectomy. Mild colonic diverticulosis.    CYSTOSCOPY WITH RETROGRADE PYELOGRAM, URETEROSCOPY AND STENT PLACEMENT Left  03/07/2020   Procedure: CYSTOSCOPY WITH LEFT RETROGRADE PYELOGRAM, URETEROSCOPY, STONE EXTRACTION,LASER  AND STENT PLACEMENT;  Surgeon: Cam Morene ORN, MD;  Location: [facility];  Service: Urology;  Laterality: Left;   PARATHYROIDECTOMY      Current Outpatient Medications  Medication Sig Dispense Refill   albuterol (PROAIR HFA) 108 (90 Base) MCG/ACT inhaler Inhale 1-2 puffs into the lungs every 6 (six) hours as needed.     aspirin 325 MG tablet Take 325 mg by mouth daily.     benzonatate (TESSALON) 200 MG capsule Take 200 mg by mouth as needed.     buPROPion  (WELLBUTRIN XL) 150 MG 24 hr tablet Take 450 mg by mouth daily.     Calcium Carbonate Antacid (TUMS PO) Take 1 tablet by mouth daily.     cholecalciferol (VITAMIN D) 1000 units tablet Take 1,000 Units by mouth daily.     clonazePAM (KLONOPIN) 0.5 MG tablet Take 0.5 mg by mouth 2 (two) times daily as needed for anxiety.     cyanocobalamin (VITAMIN B12) 1000 MCG/ML injection Inject 1,000 mcg into the muscle every 30 (thirty) days.     fenofibrate micronized (LOFIBRA) 200 MG capsule Take 200 mg by mouth daily.     ferrous sulfate 325 (65 FE) MG tablet Take 325 mg by mouth daily with breakfast.     gemfibrozil (LOPID) 600 MG tablet Take 600 mg by mouth 2 (two) times daily before a meal.     levothyroxine (SYNTHROID, LEVOTHROID) 150 MCG tablet Take 150 mcg by mouth daily.     Omega-3 Fatty Acids (FISH OIL) 1000 MG CAPS Take 1,000 mg by mouth daily.      potassium chloride (KLOR-CON M) 10 MEQ tablet Take 50 mEq by mouth daily.     venlafaxine XR (EFFEXOR-XR) 150 MG 24 hr capsule Take 150 mg by mouth daily.     VENTOLIN HFA 108 (90 Base) MCG/ACT inhaler Inhale 2 puffs into the lungs every 6 (six) hours as needed for wheezing or shortness of breath.      VIT B12-METHIONINE-INOS-CHOL IM Inject 1 each into the muscle every 30 (thirty) days.      zoledronic acid (RECLAST) 5 MG/100ML SOLN injection Inject into the vein once. Every 2 years     No current facility-administered medications for this visit.    Allergies as of 04/23/2024 - Review Complete 04/23/2024  Allergen Reaction Noted   Hydrocodone  Nausea And Vomiting and Nausea Only 02/11/2020    Family History  Problem Relation Age of Onset   Prostate cancer Father    Crohn's disease Father    Breast cancer Sister    Colon polyps Sister    Cirrhosis Sister        non alcoholic cirrhosis   Colon cancer Neg Hx        Beverly Perry DOESN'T KNOW MOM'S SIDE OF MEDICAL HISTORY    Esophageal cancer Neg Hx    Rectal cancer Neg Hx    Stomach cancer Neg Hx      Review of Systems:    Constitutional: No weight loss, fever, chills, weakness or fatigue HEENT: Eyes: No change in vision               Ears, Nose, Throat:  No change in hearing or congestion Skin: No rash or itching Cardiovascular: No chest pain, chest pressure or palpitations   Respiratory: No SOB or cough Gastrointestinal: See HPI and otherwise negative Genitourinary: No dysuria or change in urinary frequency Neurological: No headache, dizziness  or syncope Musculoskeletal: No new muscle or joint pain Hematologic: No bleeding or bruising Psychiatric: No history of depression or anxiety    Physical Exam:  Vital signs: BP 124/72   Pulse 76   Ht 5' (1.524 m)   Wt 132 lb (59.9 kg)   SpO2 96%   BMI 25.78 kg/m   Constitutional:  Pleasant female appears to be in NAD, Well developed, Well nourished, alert and cooperative Eyes:   PEERL, EOMI. No icterus. Conjunctiva pink. Neck:  Supple Throat: Oral cavity and pharynx without inflammation, swelling or lesion.  Respiratory: Respirations even and unlabored. Lungs clear to auscultation bilaterally.   No wheezes, crackles, or rhonchi.  Cardiovascular: Normal S1, S2. Regular rate and rhythm. No peripheral edema, cyanosis or pallor.  Gastrointestinal:  Soft, nondistended, nontender. No rebound or guarding. Normal bowel sounds. No appreciable masses or hepatomegaly. Rectal:  Not performed.  Msk:  Symmetrical without gross deformities. Without edema, no deformity or joint abnormality.  Neurologic:  Alert and  oriented x4;  grossly normal neurologically.  Skin:   Dry and intact without significant lesions or rashes.  RELEVANT LABS AND IMAGING: CBC    Latest Ref Rng & Units 04/04/2021    6:51 PM 03/06/2020    7:22 PM 02/04/2020    2:52 PM  CBC  WBC 4.0 - 10.5 K/uL 3.4  7.2  8.6   Hemoglobin 12.0 - 15.0 g/dL 85.1  86.9  84.9   Hematocrit 36.0 - 46.0 % 44.2  40.2  44.9   Platelets 150 - 400 K/uL 178  188  229      CMP      Latest Ref Rng & Units 04/04/2021    6:51 PM 03/06/2020    7:22 PM 02/04/2020    2:52 PM  CMP  Glucose 70 - 99 mg/dL 90  98  880   BUN 8 - 23 mg/dL 23  30  20    Creatinine 0.44 - 1.00 mg/dL 9.21  8.93  9.27   Sodium 135 - 145 mmol/L 139  140  139   Potassium 3.5 - 5.1 mmol/L 3.6  3.6  4.2   Chloride 98 - 111 mmol/L 104  102  106   CO2 22 - 32 mmol/L 26  27  24    Calcium 8.9 - 10.3 mg/dL 8.9  9.1  9.6   04/7972 labs show: B12 380, iron 38, ferritin 9, folate 11.3, hemoglobin 10.8, hematocrit 34.9 08/2023 labs show: Hemoglobin 12.2, hematocrit 38.3, B12 514, iron 75, ferritin 24, folate 9.4 03/2024 labs show: B12 418, iron 34, ferritin 21, IBC T612, folate 12.6, alk phos 69, ALT 25, AST 23, BUN 23, creatinine 1.16, TSH 0.68, hemoglobin 10.3  Assessment: Encounter Diagnoses  Name Primary?   Iron deficiency anemia, unspecified iron deficiency anemia type Yes   Nausea and vomiting, unspecified vomiting type    Melena     69 year old female Beverly Perry who presents for newly diagnosed iron deficiency (symptomatic). Hemoglobin dropped from 12.2-10.3 from [place] 2025 to December 2025 Beverly Perry on baby ASA 325 mg po daily. Also notes she was on medication for shoulder injury (NSAID)? Discontinued December. Started on PPI therapy. Last colon 10/2023 with 1 HPP, diverticular disease, and internal hemorrhoids.   Plan: -Continue omeprazole 40 mg po daily  -continue Ferrous sulfate 1 tablet po daily  -Recommend GERD diet, no late meals - No NSAID's -Schedule EGD in [facility] with Dr. Charlanne. The risks and benefits of EGD with possible biopsies and esophageal dilation  were discussed with the Beverly Perry who agrees to proceed.   Thank you for the courtesy of this consult. Please call me with any questions or concerns.   Ereka Brau, FNP-C [facility] 04/23/2024, 10:07 AM  Cc: Pia Kerney SQUIBB, MD  "

## 2024-04-30 ENCOUNTER — Encounter: Payer: Self-pay | Admitting: Gastroenterology

## 2024-04-30 ENCOUNTER — Ambulatory Visit: Admitting: Gastroenterology

## 2024-04-30 VITALS — BP 103/57 | HR 63 | Temp 97.3°F | Resp 19

## 2024-04-30 DIAGNOSIS — K2970 Gastritis, unspecified, without bleeding: Secondary | ICD-10-CM | POA: Diagnosis not present

## 2024-04-30 DIAGNOSIS — K319 Disease of stomach and duodenum, unspecified: Secondary | ICD-10-CM

## 2024-04-30 DIAGNOSIS — D509 Iron deficiency anemia, unspecified: Secondary | ICD-10-CM | POA: Diagnosis not present

## 2024-04-30 MED ORDER — SODIUM CHLORIDE 0.9 % IV SOLN: 500.0000 mL | Freq: Once | INTRAVENOUS | Status: DC

## 2024-04-30 MED ORDER — SUCRALFATE 1 G PO TABS: ORAL_TABLET | ORAL | 0 refills | Status: AC

## 2024-04-30 MED ORDER — PANTOPRAZOLE SODIUM 40 MG PO TBEC: 40.0000 mg | DELAYED_RELEASE_TABLET | Freq: Two times a day (BID) | ORAL | 4 refills | Status: AC

## 2024-04-30 NOTE — Patient Instructions (Addendum)
 Change omeprazole to protonix 40mg  po twice daily until follow up EGD, then if ulcers have completely healed daily Use sucralfate tablets 1 gram 4 times a day for 2 weeks. No ibuprofen, naproxen, or other non-steroidal anti-inflammatory drugs. Can only take baby aspirin if   prescribed/medically necessary. Repeat upper endoscopy in 12 weeks to check healing.  Schedule is not out yet- you will receive a recall letter to call and schedule this.   YOU HAD AN ENDOSCOPIC PROCEDURE TODAY AT THE  ENDOSCOPY CENTER:   Refer to the procedure report that was given to you for any specific questions about what was found during the examination.  If the procedure report does not answer your questions, please call your gastroenterologist to clarify.  If you requested that your care partner not be given the details of your procedure findings, then the procedure report has been included in a sealed envelope for you to review at your convenience later.  YOU SHOULD EXPECT: Some feelings of bloating in the abdomen. Passage of more gas than usual.  Walking can help get rid of the air that was put into your GI tract during the procedure and reduce the bloating. If you had a lower endoscopy (such as a colonoscopy or flexible sigmoidoscopy) you may notice spotting of blood in your stool or on the toilet paper. If you underwent a bowel prep for your procedure, you may not have a normal bowel movement for a few days.  Please Note:  You might notice some irritation and congestion in your nose or some drainage.  This is from the oxygen used during your procedure.  There is no need for concern and it should clear up in a day or so.  SYMPTOMS TO REPORT IMMEDIATELY:  Following upper endoscopy (EGD)  Vomiting of blood or coffee ground material  New chest pain or pain under the shoulder blades  Painful or persistently difficult swallowing  New shortness of breath  Fever of 100F or higher  Black, tarry-looking  stools  For urgent or emergent issues, a gastroenterologist can be reached at any hour by calling [phone]. Do not use MyChart messaging for urgent concerns.    DIET:  We do recommend a small meal at first, but then you may proceed to your regular diet.  Drink plenty of fluids but you should avoid alcoholic beverages for 24 hours.  ACTIVITY:  You should plan to take it easy for the rest of today and you should NOT DRIVE or use heavy machinery until tomorrow (because of the sedation medicines used during the test).    FOLLOW UP: Our staff will call the number listed on your records the next business day following your procedure.  We will call around 7:15- 8:00 am to check on you and address any questions or concerns that you may have regarding the information given to you following your procedure. If we do not reach you, we will leave a message.     If any biopsies were taken you will be contacted by phone or by letter within the next 1-3 weeks.  Please call us  at [phone] if you have not heard about the biopsies in 3 weeks.    SIGNATURES/CONFIDENTIALITY: You and/or your care partner have signed paperwork which will be entered into your electronic medical record.  These signatures attest to the fact that that the information above on your After Visit Summary has been reviewed and is understood.  Full responsibility of the confidentiality of this discharge  information lies with you and/or your care-partner.

## 2024-04-30 NOTE — Progress Notes (Signed)
 "   Chief Complaint: GERD, iron deficiency  Primary GI Doctor: Dr. Charlanne   HPI:  Patient is a  69  year old female patient with past medical history of anxiety, Graves disease, and Raynaud's syndrome, who was referred to me by Pia Kerney SQUIBB, MD on 03/22/24 for a evaluation of GERD.     Interval History Patient presents for evaluation of iron deficiency anemia.   Patient taking oral iron supplement 1 tablet po daily since December.  Patient noted black tarry stools in December for a few weeks.  She reports occasionally now seeing dark stools, unsure if due to iron. Not a vegetarian. Does not donate blood.  Patient reports she is very active person and recently has been very fatigued and overall doesn't feel well. She has been taking more naps. She has lightheadedness. She has SOB with exertion.  Patient has had nausea, very little vomiting. Patient reports intermittent RUQ cramping.  Denies GERD or dysphagia. Appetite as decreased. Lost about 3-4lbs.    Nonsmoker. No alcohol use.   Patient taking Baby ASA 325 mg po daily. No hx of stroke.   Does not take any other NSAID's. She reports she was taking something prescribed by surgeon for shoulder injury for about 8 mths but was discontinued in December due for concerns of bleeding.    She is taking Omeprazole 40 mg po daily prescribed by PCP in December.    Never had EGD.   Patient's family history includes: mothers side unknown, reports she passed away early and told by Aunt she had blood disorder?   GI procedures: 10/21/23 colonoscopy: One 6 mm polyp in the mid transverse colon, removed with a cold snare. Resected and retrieved. Moderate left colonic diverticulosis. Non- bleeding internal hemorrhoids. The examined portion of the ileum was normal. The examination was otherwise normal on direct and retroflexion views. Path: 1. Surgical [P], colon, transverse, polyp (1) :       - HYPERPLASTIC POLYP.       - NO DYSPLASIA OR MALIGNANCY.     06/2018 colonoscopy: Mild pancolonic diverticulosis. Non- bleeding internal hemorrhoids. Otherwise normal colonoscopy to TI.      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 pylori test     Raynaud's syndrome     Renal disorder     Stroke [facility])      ? CVA 3 yrs ago   UTI (urinary tract infection)                 Past Surgical History:  Procedure Laterality Date   ABDOMINAL HYSTERECTOMY       BREAST EXCISIONAL BIOPSY Right 2017   BREAST LUMPECTOMY WITH RADIOACTIVE SEED LOCALIZATION Right 03/22/2016    Procedure: RIGHT BREAST  LUMPECTOMY WITH RADIOACTIVE SEED LOCALIZATION;  Surgeon: Deward Null III, MD;  Location: [facility];  Service: General;  Laterality: Right;   CHOLECYSTECTOMY       COLONOSCOPY   11/30/2014    Colonic polyp status post polypectomy. Mild colonic diverticulosis.    CYSTOSCOPY WITH RETROGRADE PYELOGRAM, URETEROSCOPY AND STENT PLACEMENT Left 03/07/2020    Procedure: CYSTOSCOPY WITH LEFT RETROGRADE PYELOGRAM, URETEROSCOPY, STONE EXTRACTION,LASER  AND STENT PLACEMENT;  Surgeon: Cam Morene ORN, MD;  Location: [facility];  Service: Urology;  Laterality: Left;   PARATHYROIDECTOMY                    Current Outpatient Medications  Medication Sig Dispense Refill   albuterol (PROAIR HFA) 108 (90 Base) MCG/ACT inhaler Inhale 1-2 puffs into the lungs every 6 (six) hours as needed.       aspirin 325 MG tablet Take 325 mg by mouth daily.        benzonatate (TESSALON) 200 MG capsule Take 200 mg by mouth as needed.       buPROPion (WELLBUTRIN XL) 150 MG 24 hr tablet Take 450 mg by mouth daily.       Calcium Carbonate Antacid (TUMS PO) Take 1 tablet by mouth daily.       cholecalciferol (VITAMIN D) 1000 units tablet Take 1,000 Units by mouth daily.       clonazePAM (KLONOPIN) 0.5 MG tablet Take 0.5 mg by mouth 2 (two) times daily as needed for anxiety.       cyanocobalamin (VITAMIN B12) 1000 MCG/ML injection Inject 1,000 mcg into the muscle every 30 (thirty) days.       fenofibrate micronized (LOFIBRA) 200 MG capsule Take 200 mg by mouth daily.       ferrous sulfate 325 (65 FE) MG tablet Take 325 mg by mouth daily with breakfast.       gemfibrozil (LOPID) 600 MG tablet Take 600 mg by mouth 2 (two) times daily before a meal.       levothyroxine (SYNTHROID, LEVOTHROID) 150 MCG tablet Take 150 mcg by mouth daily.       Omega-3 Fatty Acids (FISH OIL) 1000 MG CAPS Take 1,000 mg by mouth daily.        potassium chloride (KLOR-CON M) 10 MEQ tablet Take 50 mEq by mouth daily.       venlafaxine XR (EFFEXOR-XR) 150 MG 24 hr capsule Take 150 mg by mouth daily.       VENTOLIN HFA 108 (90 Base) MCG/ACT inhaler Inhale 2 puffs into the lungs every 6 (six) hours as needed for wheezing or shortness of breath.        VIT B12-METHIONINE-INOS-CHOL IM Inject 1 each into the muscle every 30 (thirty) days.        zoledronic acid (RECLAST) 5 MG/100ML SOLN injection Inject into the vein once. Every 2 years          No current facility-administered medications for this visit.             Allergies as of 04/23/2024 - Review Complete 04/23/2024  Allergen Reaction Noted   Hydrocodone  Nausea And Vomiting and Nausea Only 02/11/2020           Family History  Problem Relation Age of Onset   Prostate cancer Father     Crohn's disease Father     Breast cancer Sister     Colon polyps Sister     Cirrhosis Sister  non alcoholic cirrhosis   Colon cancer  Neg Hx          PATIENT DOESN'T KNOW MOM'S SIDE OF MEDICAL HISTORY    Esophageal cancer Neg Hx     Rectal cancer Neg Hx     Stomach cancer Neg Hx            Review of Systems:    Constitutional: No weight loss, fever, chills, weakness or fatigue HEENT: Eyes: No change in vision               Ears, Nose, Throat:  No change in hearing or congestion Skin: No rash or itching Cardiovascular: No chest pain, chest pressure or palpitations   Respiratory: No SOB or cough Gastrointestinal: See HPI and otherwise negative Genitourinary: No dysuria or change in urinary frequency Neurological: No headache, dizziness or syncope Musculoskeletal: No new muscle or joint pain Hematologic: No bleeding or bruising Psychiatric: No history of depression or anxiety      Physical Exam:  Vital signs: BP 124/72   Pulse 76   Ht 5' (1.524 m)   Wt 132 lb (59.9 kg)   SpO2 96%   BMI 25.78 kg/m    Constitutional:  Pleasant female appears to be in NAD, Well developed, Well nourished, alert and cooperative Eyes:   PEERL, EOMI. No icterus. Conjunctiva pink. Neck:  Supple Throat: Oral cavity and pharynx without inflammation, swelling or lesion.  Respiratory: Respirations even and unlabored. Lungs clear to auscultation bilaterally.   No wheezes, crackles, or rhonchi.  Cardiovascular: Normal S1, S2. Regular rate and rhythm. No peripheral edema, cyanosis or pallor.  Gastrointestinal:  Soft, nondistended, nontender. No rebound or guarding. Normal bowel sounds. No appreciable masses or hepatomegaly. Rectal:  Not performed.  Msk:  Symmetrical without gross deformities. Without edema, no deformity or joint abnormality.  Neurologic:  Alert and  oriented x4;  grossly normal neurologically.  Skin:   Dry and intact without significant lesions or rashes.   RELEVANT LABS AND IMAGING: CBC     Latest Ref Rng & Units 04/04/2021    6:51 PM 03/06/2020    7:22 PM 02/04/2020    2:52 PM  CBC  WBC 4.0 - 10.5 K/uL 3.4   7.2  8.6   Hemoglobin 12.0 - 15.0 g/dL 85.1  86.9  84.9   Hematocrit 36.0 - 46.0 % 44.2  40.2  44.9   Platelets 150 - 400 K/uL 178  188  229       CMP         Latest Ref Rng & Units 04/04/2021    6:51 PM 03/06/2020    7:22 PM 02/04/2020    2:52 PM  CMP  Glucose 70 - 99 mg/dL 90  98  880   BUN 8 - 23 mg/dL 23  30  20    Creatinine 0.44 - 1.00 mg/dL 9.21  8.93  9.27   Sodium 135 - 145 mmol/L 139  140  139   Potassium 3.5 - 5.1 mmol/L 3.6  3.6  4.2   Chloride 98 - 111 mmol/L 104  102  106   CO2 22 - 32 mmol/L 26  27  24    Calcium 8.9 - 10.3 mg/dL 8.9  9.1  9.6   04/7972 labs show: B12 380, iron 38, ferritin 9, folate 11.3, hemoglobin 10.8, hematocrit 34.9 08/2023 labs show: Hemoglobin 12.2, hematocrit 38.3, B12 514, iron 75, ferritin 24, folate 9.4 03/2024 labs show: B12 418, iron 34, ferritin 21, IBC T612, folate  12.6, alk phos 69, ALT 25, AST 23, BUN 23, creatinine 1.16, TSH 0.68, hemoglobin 10.3   Assessment:     Encounter Diagnoses  Name Primary?   Iron deficiency anemia, unspecified iron deficiency anemia type Yes   Nausea and vomiting, unspecified vomiting type     Melena      69 year old female patient who presents for newly diagnosed iron deficiency (symptomatic). Hemoglobin dropped from 12.2-10.3 from May 2025 to December 2025 Patient on baby ASA 325 mg po daily. Also notes she was on medication for shoulder injury (NSAID)? Discontinued December. Started on PPI therapy. Last colon 10/2023 with 1 HPP, diverticular disease, and internal hemorrhoids.    Plan: -Continue omeprazole 40 mg po daily  -continue Ferrous sulfate 1 tablet po daily  -Recommend GERD diet, no late meals - No NSAID's -Schedule EGD in [facility] with Dr. Charlanne. The risks and benefits of EGD with possible biopsies and esophageal dilation were discussed with the patient who agrees to proceed.     Thank you for the courtesy of this consult. Please call me with any questions or concerns.    Deanna May,  FNP-C [facility] 04/23/2024, 10:07 AM   Cc: Pia Kerney SQUIBB, MD     Attending physician's note   I have taken history, reviewed the chart and examined the patient. I performed a substantive portion of this encounter, including complete performance of at least one of the key components, in conjunction with the APP. I agree with the Advanced Practitioner's note, impression and recommendations.    Anselm Charlanne, MD [facility] [phone]  "

## 2024-04-30 NOTE — Op Note (Signed)
 [facility] Patient Name: Beverly Perry Procedure Date: 04/30/2024 11:12 AM MRN: [id number] Endoscopist: Lynnie Bring , MD, [id number] Age: 69 Referring MD:  Date of Birth: 03-01-1956 Gender: Female Account #: [id number] Procedure:                Upper GI endoscopy Indications:              Epigastric abdominal pain, Iron deficiency anemia Medicines:                Monitored Anesthesia Care Procedure:                Pre-Anesthesia Assessment:                           - Prior to the procedure, a History and Physical                            was performed, and patient medications and                            allergies were reviewed. The patient's tolerance of                            previous anesthesia was also reviewed. The risks                            and benefits of the procedure and the sedation                            options and risks were discussed with the patient.                            All questions were answered, and informed consent                            was obtained. Prior Anticoagulants: The patient has                            taken no anticoagulant or antiplatelet agents. ASA                            Grade Assessment: II - A patient with mild systemic                            disease. After reviewing the risks and benefits,                            the patient was deemed in satisfactory condition to                            undergo the procedure.                           After obtaining informed consent, the endoscope was  passed under direct vision. Throughout the                            procedure, the patient's blood pressure, pulse, and                            oxygen saturations were monitored continuously. The                            GIF [id number] #[id number] was introduced through the                            mouth, and advanced to the second part of duodenum.                            The  upper GI endoscopy was accomplished without                            difficulty. The patient tolerated the procedure                            well. Scope In: Scope Out: Findings:                 The examined esophagus was normal.                           Eight non-bleeding cratered gastric ulcers with no                            stigmata of bleeding were found in the gastric                            antrum and in the prepyloric region of the stomach.                            The largest lesion was 15 mm in largest dimension.                            Biopsies were taken with a cold forceps for                            histology.                           The examined duodenum was normal. Complications:            No immediate complications. Estimated Blood Loss:     Estimated blood loss: none. Impression:               - Multiple gastric ulcers. Biopsied. Recommendation:           - Patient has a contact number available for                            emergencies. The signs and symptoms of potential  delayed complications were discussed with the                            patient. Return to normal activities tomorrow.                            Written discharge instructions were provided to the                            patient.                           - Resume previous diet.                           - Change omeprazole to protonix 40mg  po BID until                            FU EGD, then if ulcers have completely healed,QD                           - Use sucralfate tablets 1 gram PO QID for 2 weeks.                           - No ibuprofen, naproxen, or other non-steroidal                            anti-inflammatory drugs. Can only take bASA if                            prescribed/medically necessary.                           - Repeat upper endoscopy in 12 weeks to check                            healing.                           - The  findings and recommendations were discussed                            with the patient's family. Lynnie Bring, MD 04/30/2024 11:46:05 AM This report has been signed electronically.

## 2024-04-30 NOTE — Progress Notes (Unsigned)
 Pt's states no medical or surgical changes since previsit or office visit.

## 2024-04-30 NOTE — Progress Notes (Signed)
 Sedate, gd SR, tolerated procedure well, VSS, report to RN

## 2024-04-30 NOTE — Progress Notes (Signed)
 Called to room to assist during endoscopic procedure.  Patient ID and intended procedure confirmed with present staff. Received instructions for my participation in the procedure from the performing physician.

## 2024-05-04 ENCOUNTER — Telehealth: Payer: Self-pay | Admitting: *Deleted

## 2024-05-04 NOTE — Telephone Encounter (Signed)
" °  Follow up Call-     04/30/2024   10:36 AM 10/21/2023    1:00 PM  Call back number  Post procedure Call Back phone  # [phone]  Permission to leave phone message Yes Yes     Patient questions:  Do you have a fever, pain , or abdominal swelling? No. Pain Score  0 *  Have you tolerated food without any problems? Yes.    Have you been able to return to your normal activities? Yes.    Do you have any questions about your discharge instructions: Diet   No. Medications  No. Follow up visit  No.  Do you have questions or concerns about your Care? No.  Actions: * If pain score is 4 or above: No action needed, pain <4.   "

## 2024-05-06 LAB — SURGICAL PATHOLOGY

## 2024-05-08 ENCOUNTER — Ambulatory Visit: Payer: Self-pay | Admitting: Gastroenterology

## 2024-05-14 ENCOUNTER — Telehealth: Payer: Self-pay

## 2024-05-14 DIAGNOSIS — K259 Gastric ulcer, unspecified as acute or chronic, without hemorrhage or perforation: Secondary | ICD-10-CM

## 2024-05-14 DIAGNOSIS — R1013 Epigastric pain: Secondary | ICD-10-CM

## 2024-05-14 DIAGNOSIS — D509 Iron deficiency anemia, unspecified: Secondary | ICD-10-CM

## 2024-05-14 NOTE — Telephone Encounter (Signed)
 Patient scheduled for 08-03-24 for EGD   Instructions mailed off to patient and will contact her Monday to see about any questions regarding instructions.  Amb ref done too

## 2024-08-03 ENCOUNTER — Encounter: Admitting: Gastroenterology
